# Patient Record
Sex: Female | Born: 1995
Health system: Southern US, Community
[De-identification: ages and names within clinical notes are randomized; demographics above are authoritative.]

## PROBLEM LIST (undated history)

## (undated) DIAGNOSIS — R011 Cardiac murmur, unspecified: Secondary | ICD-10-CM

## (undated) DIAGNOSIS — G7111 Myotonic muscular dystrophy: Secondary | ICD-10-CM

## (undated) DIAGNOSIS — R569 Unspecified convulsions: Secondary | ICD-10-CM

## (undated) DIAGNOSIS — K900 Celiac disease: Secondary | ICD-10-CM

## (undated) DIAGNOSIS — R220 Localized swelling, mass and lump, head: Secondary | ICD-10-CM

## (undated) HISTORY — DX: Myotonic muscular dystrophy: G71.11

---
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
## 2016-01-01 DIAGNOSIS — Z309 Encounter for contraceptive management, unspecified: Secondary | ICD-10-CM | POA: Diagnosis not present

## 2016-01-01 DIAGNOSIS — G4700 Insomnia, unspecified: Secondary | ICD-10-CM | POA: Diagnosis not present

## 2016-06-30 DIAGNOSIS — L309 Dermatitis, unspecified: Secondary | ICD-10-CM | POA: Diagnosis not present

## 2016-06-30 DIAGNOSIS — R1110 Vomiting, unspecified: Secondary | ICD-10-CM | POA: Diagnosis not present

## 2016-11-24 DIAGNOSIS — S93491A Sprain of other ligament of right ankle, initial encounter: Secondary | ICD-10-CM | POA: Diagnosis not present

## 2017-01-31 ENCOUNTER — Other Ambulatory Visit: Payer: Self-pay | Admitting: Family Medicine

## 2017-01-31 ENCOUNTER — Other Ambulatory Visit (HOSPITAL_COMMUNITY)
Admission: RE | Admit: 2017-01-31 | Discharge: 2017-01-31 | Disposition: A | Discharge status: Home / Self Care | Payer: BLUE CROSS/BLUE SHIELD | Source: Ambulatory Visit | Attending: Family Medicine | Admitting: Family Medicine

## 2017-01-31 DIAGNOSIS — Z01419 Encounter for gynecological examination (general) (routine) without abnormal findings: Secondary | ICD-10-CM | POA: Diagnosis not present

## 2017-01-31 DIAGNOSIS — Z309 Encounter for contraceptive management, unspecified: Secondary | ICD-10-CM | POA: Diagnosis not present

## 2017-01-31 DIAGNOSIS — Z124 Encounter for screening for malignant neoplasm of cervix: Secondary | ICD-10-CM | POA: Diagnosis not present

## 2017-01-31 DIAGNOSIS — Z113 Encounter for screening for infections with a predominantly sexual mode of transmission: Secondary | ICD-10-CM | POA: Diagnosis not present

## 2017-02-01 LAB — CYTOLOGY - PAP: DIAGNOSIS: NEGATIVE

## 2017-04-20 DIAGNOSIS — J0120 Acute ethmoidal sinusitis, unspecified: Secondary | ICD-10-CM | POA: Diagnosis not present

## 2017-06-26 DIAGNOSIS — J209 Acute bronchitis, unspecified: Secondary | ICD-10-CM | POA: Diagnosis not present

## 2018-04-09 DIAGNOSIS — J209 Acute bronchitis, unspecified: Secondary | ICD-10-CM | POA: Diagnosis not present

## 2018-04-09 DIAGNOSIS — Z23 Encounter for immunization: Secondary | ICD-10-CM | POA: Diagnosis not present

## 2018-04-09 DIAGNOSIS — J029 Acute pharyngitis, unspecified: Secondary | ICD-10-CM | POA: Diagnosis not present

## 2018-04-09 DIAGNOSIS — R5383 Other fatigue: Secondary | ICD-10-CM | POA: Diagnosis not present

## 2018-05-07 DIAGNOSIS — Z833 Family history of diabetes mellitus: Secondary | ICD-10-CM | POA: Diagnosis not present

## 2018-05-07 DIAGNOSIS — Z113 Encounter for screening for infections with a predominantly sexual mode of transmission: Secondary | ICD-10-CM | POA: Diagnosis not present

## 2018-05-07 DIAGNOSIS — Z01419 Encounter for gynecological examination (general) (routine) without abnormal findings: Secondary | ICD-10-CM | POA: Diagnosis not present

## 2018-05-07 DIAGNOSIS — Z8342 Family history of familial hypercholesterolemia: Secondary | ICD-10-CM | POA: Diagnosis not present

## 2018-05-10 DIAGNOSIS — J45909 Unspecified asthma, uncomplicated: Secondary | ICD-10-CM | POA: Diagnosis not present

## 2018-05-30 DIAGNOSIS — R05 Cough: Secondary | ICD-10-CM | POA: Diagnosis not present

## 2018-05-30 DIAGNOSIS — R062 Wheezing: Secondary | ICD-10-CM | POA: Diagnosis not present

## 2018-05-30 DIAGNOSIS — Z3042 Encounter for surveillance of injectable contraceptive: Secondary | ICD-10-CM | POA: Diagnosis not present

## 2018-06-13 ENCOUNTER — Other Ambulatory Visit: Payer: Self-pay

## 2018-06-13 ENCOUNTER — Ambulatory Visit (INDEPENDENT_AMBULATORY_CARE_PROVIDER_SITE_OTHER): Payer: BLUE CROSS/BLUE SHIELD

## 2018-06-13 ENCOUNTER — Ambulatory Visit (HOSPITAL_COMMUNITY)
Admission: EM | Admit: 2018-06-13 | Discharge: 2018-06-13 | Disposition: A | Discharge status: Home / Self Care | Payer: BLUE CROSS/BLUE SHIELD | Attending: Family Medicine | Admitting: Family Medicine

## 2018-06-13 ENCOUNTER — Encounter (HOSPITAL_COMMUNITY): Payer: Self-pay | Admitting: Emergency Medicine

## 2018-06-13 DIAGNOSIS — R05 Cough: Secondary | ICD-10-CM

## 2018-06-13 DIAGNOSIS — R059 Cough, unspecified: Secondary | ICD-10-CM

## 2018-06-13 DIAGNOSIS — R0602 Shortness of breath: Secondary | ICD-10-CM | POA: Diagnosis not present

## 2018-06-13 MED ORDER — HYDROCODONE-HOMATROPINE 5-1.5 MG/5ML PO SYRP: 5.0000 mL | ORAL_SOLUTION | Freq: Four times a day (QID) | ORAL | 0 refills | Status: DC | PRN

## 2018-06-13 NOTE — ED Triage Notes (Signed)
Diagnosed with bronchitis in january 2020-minute clinc-albuterol and congestion medicine.  Seen pcp 2 times .  One time given zpac and prednisone with specific dose x 5 days.  A second visit.  Received asmanex and taking dayquil.    Patient reports no improvement.    Complains of sob, wheezing, facial pressure and bilateral ear pressure.  Constant coughing,no fever

## 2018-06-18 DIAGNOSIS — Z3042 Encounter for surveillance of injectable contraceptive: Secondary | ICD-10-CM | POA: Diagnosis not present

## 2018-06-18 NOTE — ED Provider Notes (Signed)
[place]   [id number] 06/13/18 Arrival Time: [place]:  1. Cough    I have personally viewed the imaging studies ordered this visit. No signs of PNA or infectious process. Discussed. Likely post-viral. Will watch closely over the next week.  Meds ordered this encounter  Medications  . HYDROcodone-homatropine (HYCODAN) 5-1.5 MG/5ML syrup    Sig: Take 5 mLs by mouth every 6 (six) hours as needed for cough.    Dispense:  90 mL    Refill:  0   Cough medication sedation precautions. Discussed typical duration of symptoms. OTC symptom care as needed. Ensure adequate fluid intake and rest. May f/u with PCP or here as needed.  Reviewed expectations re: course of current medical issues. Questions answered. Outlined signs and symptoms indicating need for more acute intervention. Patient verbalized understanding. After Visit Summary given.   SUBJECTIVE: History from: patient.  Chloe Rice is a 23 y.o. female who presents with complaint of nasal congestion, post-nasal drainage, and a persistent dry cough; without sore throat. Cold symptoms better; cough is bothering her the most. Onset of coughing abrupt, 2 weeks ago; currently without significant fatigue and without body aches. SOB: none. Wheezing: none. Fever: no. Overall normal PO intake without n/v. Known sick contacts: no. No specific or significant aggravating or alleviating factors reported. OTC treatment: cough meds without much relief. Has finished a ZPak and prednisone without much help.  Social History   Tobacco Use  Smoking Status Never Smoker  Smokeless Tobacco Never Used    ROS: As per HPI. All other systems negative.    OBJECTIVE:  Vitals:   06/13/18 1938  BP: 120/80  Pulse: (!) 103  Resp: 18  Temp: 99.5 F (37.5 C)  TempSrc: Oral  SpO2: 97%     General appearance: alert; appears fatigued HEENT: nasal congestion; clear runny nose; throat irritation secondary to post-nasal  drainage Neck: supple without LAD CV: RRR Lungs: unlabored respirations, symmetrical air entry without wheezing; cough: moderate and dry Abd: soft Ext: no LE edema Skin: warm and dry Psychological: alert and cooperative; normal mood and affect  Imaging: Dg Chest 2 View  Result Date: 06/13/2018 CLINICAL DATA:  23 year old female with cough and shortness of breath. EXAM: CHEST - 2 VIEW COMPARISON:  None. FINDINGS: Normal lung volumes and mediastinal contours. Visualized tracheal air column is within normal limits. Both lungs are clear. No pneumothorax or pleural effusion. No osseous abnormality identified. Negative visible bowel gas pattern. IMPRESSION: Negative.  No cardiopulmonary abnormality. Electronically Signed   By: Genevie Ann M.D.   On: 06/13/2018 20:07    Allergies  Allergen Reactions  . Gluten Meal Other (See Comments)    CELIAC DISEASE    Past Medical History:  Diagnosis Date  . Celiac disease since age 54  . Heart murmur    as an infant, mother states she was "cleared" at age 26 mos.  . Seizure [facility]) age 61   x 1; unknown cause, was not on anticonvulsant  . Seizures [facility]) age 8 mos.   febrile x 2  . Superficial swelling of scalp 06/2012   benign nodular swelling left occipital scalp   Family History  Problem Relation Age of Onset  . Heart disease Maternal Grandmother        MI  . Kidney disease Maternal Grandmother        was on dialysis  . Stroke Paternal Grandmother   . Heart disease Paternal Grandfather   . Asthma Mother   .  Hypertension Father        treating with diet and exercise  . Diabetes Maternal Uncle        juvenile   Social History   Socioeconomic History  . Marital status: Single    Spouse name: Not on file  . Number of children: Not on file  . Years of education: Not on file  . Highest education level: Not on file  Occupational History  . Not on file  Social Needs  . Financial resource strain: Not on file  . Food insecurity:    Worry:  Not on file    Inability: Not on file  . Transportation needs:    Medical: Not on file    Non-medical: Not on file  Tobacco Use  . Smoking status: Never Smoker  . Smokeless tobacco: Never Used  Substance and Sexual Activity  . Alcohol use: No  . Drug use: No  . Sexual activity: Not on file  Lifestyle  . Physical activity:    Days per week: Not on file    Minutes per session: Not on file  . Stress: Not on file  Relationships  . Social connections:    Talks on phone: Not on file    Gets together: Not on file    Attends religious service: Not on file    Active member of club or organization: Not on file    Attends meetings of clubs or organizations: Not on file    Relationship status: Not on file  . Intimate partner violence:    Fear of current or ex partner: Not on file    Emotionally abused: Not on file    Physically abused: Not on file    Forced sexual activity: Not on file  Other Topics Concern  . Not on file  Social History Narrative  . Not on file           Vanessa Kick, MD 06/18/18 [phone]

## 2018-06-20 DIAGNOSIS — R0989 Other specified symptoms and signs involving the circulatory and respiratory systems: Secondary | ICD-10-CM | POA: Diagnosis not present

## 2018-06-20 DIAGNOSIS — R05 Cough: Secondary | ICD-10-CM | POA: Diagnosis not present

## 2018-06-20 DIAGNOSIS — R232 Flushing: Secondary | ICD-10-CM | POA: Diagnosis not present

## 2018-07-04 ENCOUNTER — Institutional Professional Consult (permissible substitution): Payer: BLUE CROSS/BLUE SHIELD | Admitting: Pulmonary Disease

## 2018-08-22 DIAGNOSIS — Z3042 Encounter for surveillance of injectable contraceptive: Secondary | ICD-10-CM | POA: Diagnosis not present

## 2018-11-09 DIAGNOSIS — Z3042 Encounter for surveillance of injectable contraceptive: Secondary | ICD-10-CM | POA: Diagnosis not present

## 2019-01-28 DIAGNOSIS — Z3042 Encounter for surveillance of injectable contraceptive: Secondary | ICD-10-CM | POA: Diagnosis not present

## 2019-04-24 DIAGNOSIS — Z3042 Encounter for surveillance of injectable contraceptive: Secondary | ICD-10-CM | POA: Diagnosis not present

## 2019-04-26 DIAGNOSIS — F411 Generalized anxiety disorder: Secondary | ICD-10-CM | POA: Diagnosis not present

## 2019-04-26 DIAGNOSIS — G4700 Insomnia, unspecified: Secondary | ICD-10-CM | POA: Diagnosis not present

## 2019-05-13 DIAGNOSIS — F419 Anxiety disorder, unspecified: Secondary | ICD-10-CM | POA: Diagnosis not present

## 2019-07-12 DIAGNOSIS — H538 Other visual disturbances: Secondary | ICD-10-CM | POA: Diagnosis not present

## 2019-07-12 DIAGNOSIS — F419 Anxiety disorder, unspecified: Secondary | ICD-10-CM | POA: Diagnosis not present

## 2019-07-22 DIAGNOSIS — Z3042 Encounter for surveillance of injectable contraceptive: Secondary | ICD-10-CM | POA: Diagnosis not present

## 2019-07-30 DIAGNOSIS — H5203 Hypermetropia, bilateral: Secondary | ICD-10-CM | POA: Diagnosis not present

## 2019-07-30 DIAGNOSIS — H25043 Posterior subcapsular polar age-related cataract, bilateral: Secondary | ICD-10-CM | POA: Diagnosis not present

## 2019-08-06 DIAGNOSIS — H25042 Posterior subcapsular polar age-related cataract, left eye: Secondary | ICD-10-CM | POA: Diagnosis not present

## 2019-08-06 DIAGNOSIS — Z01818 Encounter for other preprocedural examination: Secondary | ICD-10-CM | POA: Diagnosis not present

## 2019-08-15 DIAGNOSIS — H25042 Posterior subcapsular polar age-related cataract, left eye: Secondary | ICD-10-CM | POA: Diagnosis not present

## 2019-08-15 DIAGNOSIS — H2512 Age-related nuclear cataract, left eye: Secondary | ICD-10-CM | POA: Diagnosis not present

## 2019-08-15 DIAGNOSIS — H25812 Combined forms of age-related cataract, left eye: Secondary | ICD-10-CM | POA: Diagnosis not present

## 2019-09-12 DIAGNOSIS — H25811 Combined forms of age-related cataract, right eye: Secondary | ICD-10-CM | POA: Diagnosis not present

## 2019-09-12 DIAGNOSIS — H25041 Posterior subcapsular polar age-related cataract, right eye: Secondary | ICD-10-CM | POA: Diagnosis not present

## 2019-09-12 DIAGNOSIS — H2511 Age-related nuclear cataract, right eye: Secondary | ICD-10-CM | POA: Diagnosis not present

## 2019-10-11 DIAGNOSIS — G4700 Insomnia, unspecified: Secondary | ICD-10-CM | POA: Diagnosis not present

## 2019-10-15 DIAGNOSIS — Z3042 Encounter for surveillance of injectable contraceptive: Secondary | ICD-10-CM | POA: Diagnosis not present

## 2019-11-13 DIAGNOSIS — Z124 Encounter for screening for malignant neoplasm of cervix: Secondary | ICD-10-CM | POA: Diagnosis not present

## 2019-11-13 DIAGNOSIS — Z01419 Encounter for gynecological examination (general) (routine) without abnormal findings: Secondary | ICD-10-CM | POA: Diagnosis not present

## 2019-11-13 DIAGNOSIS — Z862 Personal history of diseases of the blood and blood-forming organs and certain disorders involving the immune mechanism: Secondary | ICD-10-CM | POA: Diagnosis not present

## 2019-11-13 DIAGNOSIS — E669 Obesity, unspecified: Secondary | ICD-10-CM | POA: Diagnosis not present

## 2019-11-13 DIAGNOSIS — Z13220 Encounter for screening for lipoid disorders: Secondary | ICD-10-CM | POA: Diagnosis not present

## 2019-11-13 DIAGNOSIS — Z113 Encounter for screening for infections with a predominantly sexual mode of transmission: Secondary | ICD-10-CM | POA: Diagnosis not present

## 2019-12-31 DIAGNOSIS — Z3042 Encounter for surveillance of injectable contraceptive: Secondary | ICD-10-CM | POA: Diagnosis not present

## 2020-01-17 IMAGING — DX CHEST - 2 VIEW
2 series · 2 of 2 positions shown · non-contrast
Comparison: None.

CLINICAL DATA: 22-year-old female with cough and shortness of
breath.

EXAM:
CHEST - 2 VIEW

[chest pa]
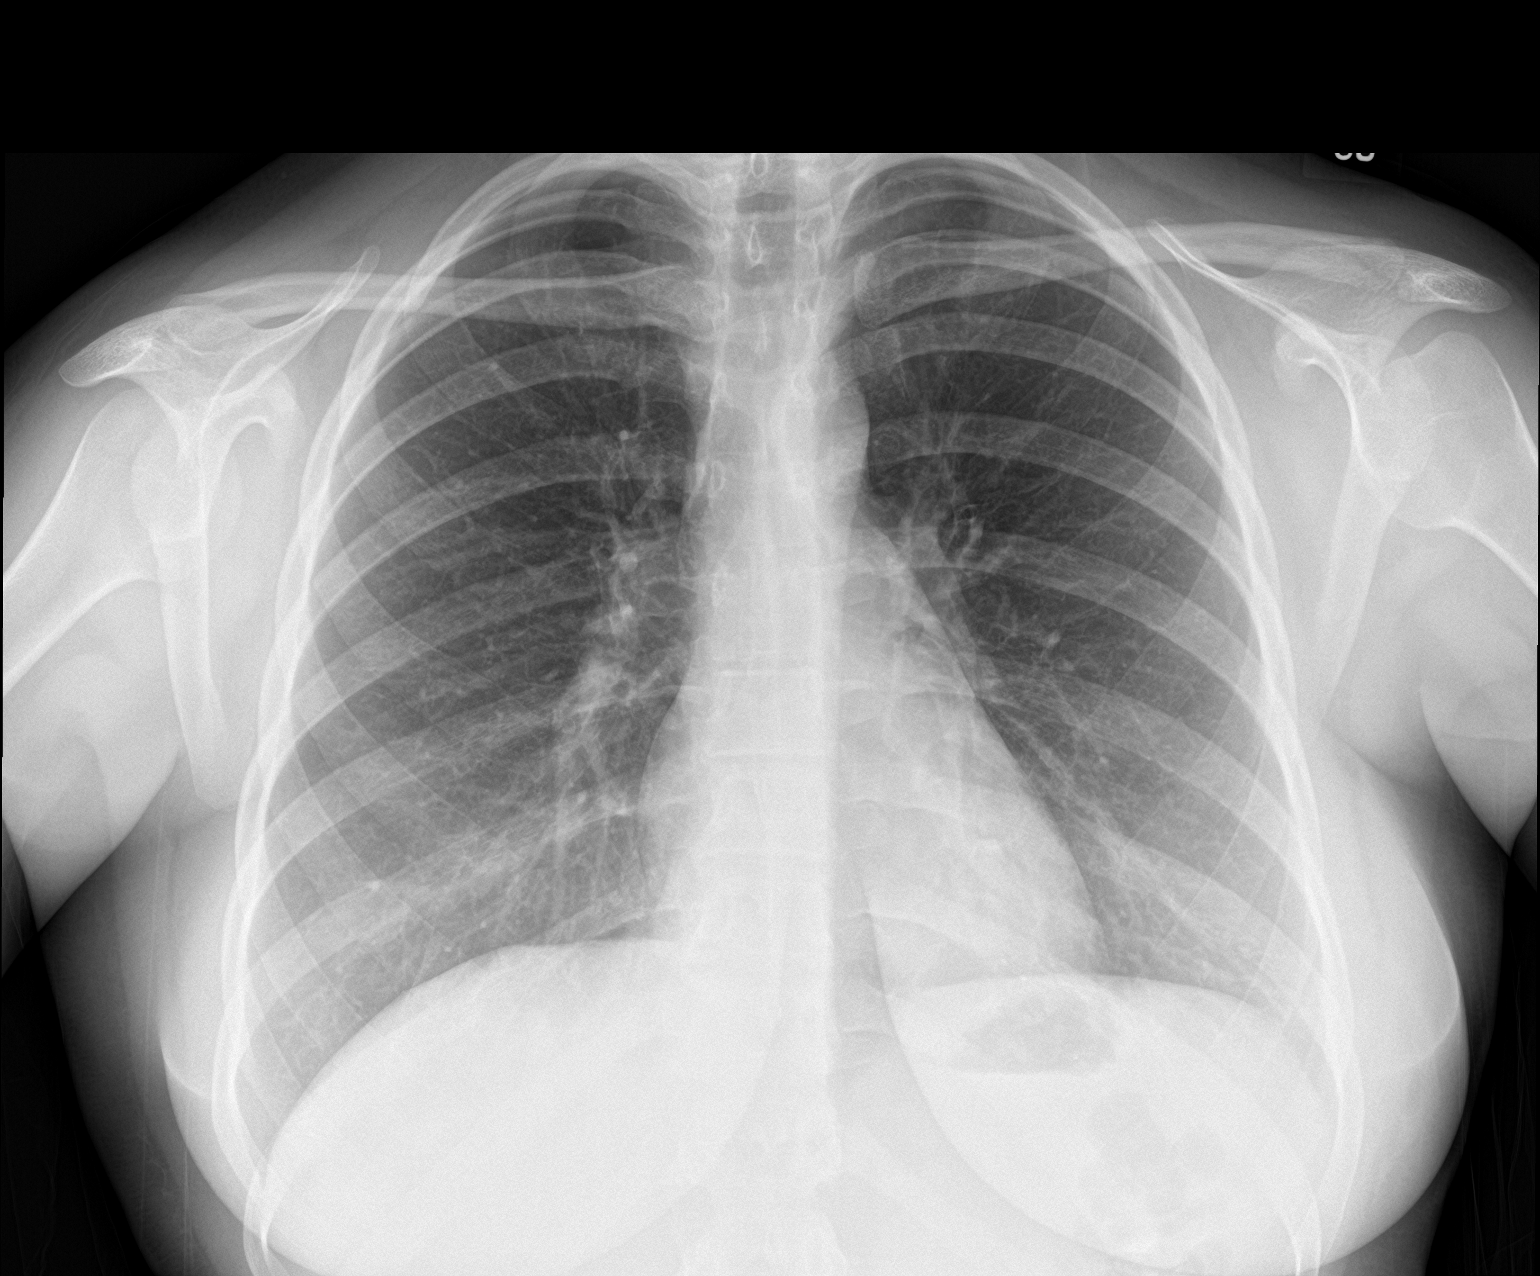

[chest lat]
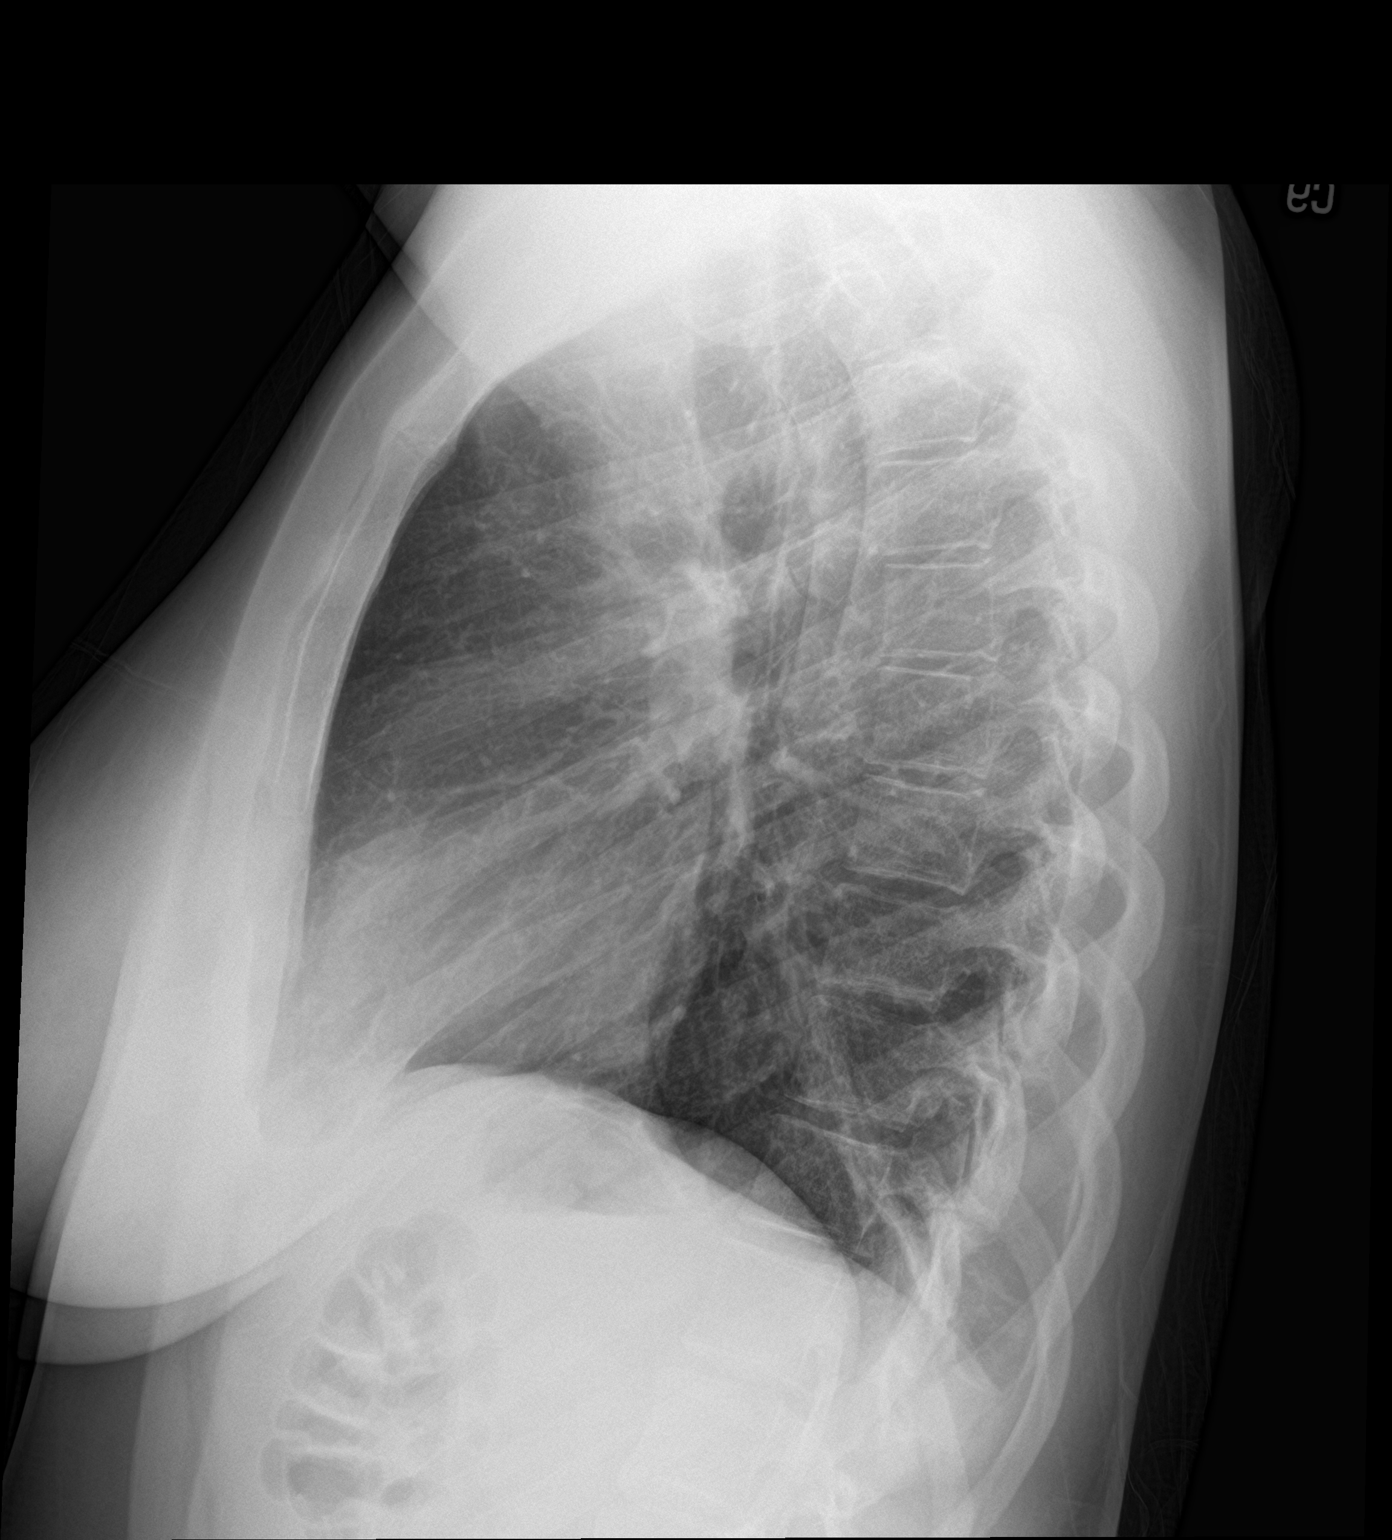

[2 of 2 positions shown; findings below may reference images not displayed]

FINDINGS: Normal lung volumes and mediastinal contours. Visualized tracheal
air column is within normal limits. Both lungs are clear. No
pneumothorax or pleural effusion. No osseous abnormality identified.
Negative visible bowel gas pattern.
IMPRESSION: Negative.  No cardiopulmonary abnormality.

## 2020-04-01 ENCOUNTER — Other Ambulatory Visit (HOSPITAL_COMMUNITY): Payer: Self-pay | Admitting: Family Medicine

## 2020-04-01 DIAGNOSIS — Z3042 Encounter for surveillance of injectable contraceptive: Secondary | ICD-10-CM | POA: Diagnosis not present

## 2020-04-01 DIAGNOSIS — Z3202 Encounter for pregnancy test, result negative: Secondary | ICD-10-CM | POA: Diagnosis not present

## 2020-04-09 DIAGNOSIS — U071 COVID-19: Secondary | ICD-10-CM | POA: Diagnosis not present

## 2020-06-17 DIAGNOSIS — Z3042 Encounter for surveillance of injectable contraceptive: Secondary | ICD-10-CM | POA: Diagnosis not present

## 2020-06-24 DIAGNOSIS — G479 Sleep disorder, unspecified: Secondary | ICD-10-CM | POA: Diagnosis not present

## 2020-06-24 DIAGNOSIS — F411 Generalized anxiety disorder: Secondary | ICD-10-CM | POA: Diagnosis not present

## 2020-09-02 DIAGNOSIS — Z3042 Encounter for surveillance of injectable contraceptive: Secondary | ICD-10-CM | POA: Diagnosis not present

## 2020-09-26 DIAGNOSIS — N3001 Acute cystitis with hematuria: Secondary | ICD-10-CM | POA: Diagnosis not present

## 2020-09-26 DIAGNOSIS — M542 Cervicalgia: Secondary | ICD-10-CM | POA: Diagnosis not present

## 2020-09-26 DIAGNOSIS — R110 Nausea: Secondary | ICD-10-CM | POA: Diagnosis not present

## 2020-09-26 DIAGNOSIS — S161XXA Strain of muscle, fascia and tendon at neck level, initial encounter: Secondary | ICD-10-CM | POA: Diagnosis not present

## 2020-09-26 DIAGNOSIS — M79601 Pain in right arm: Secondary | ICD-10-CM | POA: Diagnosis not present

## 2020-09-26 DIAGNOSIS — R519 Headache, unspecified: Secondary | ICD-10-CM | POA: Diagnosis not present

## 2020-11-16 DIAGNOSIS — J029 Acute pharyngitis, unspecified: Secondary | ICD-10-CM | POA: Diagnosis not present

## 2020-11-16 DIAGNOSIS — Z20822 Contact with and (suspected) exposure to covid-19: Secondary | ICD-10-CM | POA: Diagnosis not present

## 2020-11-16 DIAGNOSIS — J069 Acute upper respiratory infection, unspecified: Secondary | ICD-10-CM | POA: Diagnosis not present

## 2020-11-30 DIAGNOSIS — Z3042 Encounter for surveillance of injectable contraceptive: Secondary | ICD-10-CM | POA: Diagnosis not present

## 2021-02-01 DIAGNOSIS — J111 Influenza due to unidentified influenza virus with other respiratory manifestations: Secondary | ICD-10-CM | POA: Diagnosis not present

## 2021-02-01 DIAGNOSIS — R509 Fever, unspecified: Secondary | ICD-10-CM | POA: Diagnosis not present

## 2021-02-01 DIAGNOSIS — Z20822 Contact with and (suspected) exposure to covid-19: Secondary | ICD-10-CM | POA: Diagnosis not present

## 2021-02-01 DIAGNOSIS — Z03818 Encounter for observation for suspected exposure to other biological agents ruled out: Secondary | ICD-10-CM | POA: Diagnosis not present

## 2021-02-09 DIAGNOSIS — F411 Generalized anxiety disorder: Secondary | ICD-10-CM | POA: Diagnosis not present

## 2021-02-09 DIAGNOSIS — G479 Sleep disorder, unspecified: Secondary | ICD-10-CM | POA: Diagnosis not present

## 2021-02-17 DIAGNOSIS — Z01419 Encounter for gynecological examination (general) (routine) without abnormal findings: Secondary | ICD-10-CM | POA: Diagnosis not present

## 2021-02-17 DIAGNOSIS — Z3042 Encounter for surveillance of injectable contraceptive: Secondary | ICD-10-CM | POA: Diagnosis not present

## 2021-03-30 DIAGNOSIS — Z20822 Contact with and (suspected) exposure to covid-19: Secondary | ICD-10-CM | POA: Diagnosis not present

## 2021-03-30 DIAGNOSIS — Z03818 Encounter for observation for suspected exposure to other biological agents ruled out: Secondary | ICD-10-CM | POA: Diagnosis not present

## 2021-10-08 ENCOUNTER — Other Ambulatory Visit: Payer: Self-pay

## 2021-10-08 ENCOUNTER — Encounter (HOSPITAL_BASED_OUTPATIENT_CLINIC_OR_DEPARTMENT_OTHER): Payer: Self-pay

## 2021-10-08 ENCOUNTER — Emergency Department (HOSPITAL_BASED_OUTPATIENT_CLINIC_OR_DEPARTMENT_OTHER)
Admission: EM | Admit: 2021-10-08 | Discharge: 2021-10-08 | Discharge status: Against Medical Advice | Payer: Self-pay | Attending: Emergency Medicine | Admitting: Emergency Medicine

## 2021-10-08 DIAGNOSIS — Z5321 Procedure and treatment not carried out due to patient leaving prior to being seen by health care provider: Secondary | ICD-10-CM | POA: Insufficient documentation

## 2021-10-08 DIAGNOSIS — M545 Low back pain, unspecified: Secondary | ICD-10-CM | POA: Insufficient documentation

## 2021-10-08 DIAGNOSIS — M549 Dorsalgia, unspecified: Secondary | ICD-10-CM

## 2021-10-08 NOTE — ED Notes (Signed)
Pt stated she is not having any pain at this time and has changed her mind on being seen. Pt signed AMA form and left.

## 2021-10-08 NOTE — ED Triage Notes (Signed)
Pt reports persistent, intermittent back pain xseveral weeks. No injury reported. Pt ambulatory to triage.

## 2021-10-22 ENCOUNTER — Other Ambulatory Visit: Payer: Self-pay

## 2021-10-22 MED ORDER — MEDROXYPROGESTERONE ACETATE 150 MG/ML IM SUSY
PREFILLED_SYRINGE | INTRAMUSCULAR | 0 refills | Status: DC
  Filled 2021-10-22: qty 1, 90d supply, fill #0

## 2022-03-07 ENCOUNTER — Other Ambulatory Visit: Payer: Self-pay | Admitting: Nurse Practitioner

## 2022-03-07 DIAGNOSIS — N644 Mastodynia: Secondary | ICD-10-CM

## 2022-04-01 ENCOUNTER — Other Ambulatory Visit: Payer: Self-pay | Admitting: Family Medicine

## 2022-04-01 DIAGNOSIS — R221 Localized swelling, mass and lump, neck: Secondary | ICD-10-CM

## 2022-04-13 ENCOUNTER — Ambulatory Visit
Admission: RE | Admit: 2022-04-13 | Discharge: 2022-04-13 | Disposition: A | Discharge status: Home / Self Care | Payer: 59 | Source: Ambulatory Visit | Attending: Family Medicine | Admitting: Family Medicine

## 2022-04-13 DIAGNOSIS — R221 Localized swelling, mass and lump, neck: Secondary | ICD-10-CM

## 2022-04-19 ENCOUNTER — Ambulatory Visit
Admission: RE | Admit: 2022-04-19 | Discharge: 2022-04-19 | Disposition: A | Discharge status: Home / Self Care | Payer: 59 | Source: Ambulatory Visit | Attending: Nurse Practitioner | Admitting: Nurse Practitioner

## 2022-04-19 DIAGNOSIS — N644 Mastodynia: Secondary | ICD-10-CM

## 2023-03-09 ENCOUNTER — Other Ambulatory Visit: Payer: Self-pay | Admitting: Nurse Practitioner

## 2023-03-09 ENCOUNTER — Other Ambulatory Visit (HOSPITAL_COMMUNITY)
Admission: RE | Admit: 2023-03-09 | Discharge: 2023-03-09 | Disposition: A | Discharge status: Home / Self Care | Payer: 59 | Source: Ambulatory Visit | Attending: Nurse Practitioner | Admitting: Nurse Practitioner

## 2023-03-09 DIAGNOSIS — Z124 Encounter for screening for malignant neoplasm of cervix: Secondary | ICD-10-CM | POA: Diagnosis present

## 2023-03-13 LAB — CYTOLOGY - PAP: Diagnosis: NEGATIVE

## 2023-07-02 ENCOUNTER — Emergency Department (HOSPITAL_BASED_OUTPATIENT_CLINIC_OR_DEPARTMENT_OTHER): Admitting: Radiology

## 2023-07-02 ENCOUNTER — Encounter (HOSPITAL_BASED_OUTPATIENT_CLINIC_OR_DEPARTMENT_OTHER): Payer: Self-pay | Admitting: Emergency Medicine

## 2023-07-02 ENCOUNTER — Other Ambulatory Visit: Payer: Self-pay

## 2023-07-02 DIAGNOSIS — R739 Hyperglycemia, unspecified: Secondary | ICD-10-CM | POA: Insufficient documentation

## 2023-07-02 DIAGNOSIS — K859 Acute pancreatitis without necrosis or infection, unspecified: Secondary | ICD-10-CM | POA: Insufficient documentation

## 2023-07-02 DIAGNOSIS — R0602 Shortness of breath: Secondary | ICD-10-CM | POA: Diagnosis not present

## 2023-07-02 DIAGNOSIS — R1011 Right upper quadrant pain: Secondary | ICD-10-CM | POA: Diagnosis present

## 2023-07-02 LAB — CBC
HCT: 46.9 % — ABNORMAL HIGH (ref 36.0–46.0)
Hemoglobin: 15.4 g/dL — ABNORMAL HIGH (ref 12.0–15.0)
MCH: 29.5 pg (ref 26.0–34.0)
MCHC: 32.8 g/dL (ref 30.0–36.0)
MCV: 89.8 fL (ref 80.0–100.0)
Platelets: 193 10*3/uL (ref 150–400)
RBC: 5.22 MIL/uL — ABNORMAL HIGH (ref 3.87–5.11)
RDW: 13.7 % (ref 11.5–15.5)
WBC: 8.3 10*3/uL (ref 4.0–10.5)
nRBC: 0 % (ref 0.0–0.2)

## 2023-07-02 NOTE — ED Triage Notes (Signed)
 Pt c/o back pain x 4 days, today abd pain, chest pain, dizziness, nausea started

## 2023-07-03 ENCOUNTER — Emergency Department (HOSPITAL_BASED_OUTPATIENT_CLINIC_OR_DEPARTMENT_OTHER)
Admission: EM | Admit: 2023-07-03 | Discharge: 2023-07-03 | Disposition: A | Discharge status: Home / Self Care | Attending: Emergency Medicine | Admitting: Emergency Medicine

## 2023-07-03 ENCOUNTER — Emergency Department (HOSPITAL_BASED_OUTPATIENT_CLINIC_OR_DEPARTMENT_OTHER)

## 2023-07-03 DIAGNOSIS — K8010 Calculus of gallbladder with chronic cholecystitis without obstruction: Secondary | ICD-10-CM | POA: Diagnosis not present

## 2023-07-03 DIAGNOSIS — R739 Hyperglycemia, unspecified: Secondary | ICD-10-CM

## 2023-07-03 DIAGNOSIS — K859 Acute pancreatitis without necrosis or infection, unspecified: Secondary | ICD-10-CM

## 2023-07-03 DIAGNOSIS — J9811 Atelectasis: Secondary | ICD-10-CM | POA: Diagnosis not present

## 2023-07-03 LAB — BASIC METABOLIC PANEL WITH GFR
Anion gap: 9 (ref 5–15)
BUN: 13 mg/dL (ref 6–20)
CO2: 25 mmol/L (ref 22–32)
Calcium: 9.7 mg/dL (ref 8.9–10.3)
Chloride: 106 mmol/L (ref 98–111)
Creatinine, Ser: 0.65 mg/dL (ref 0.44–1.00)
GFR, Estimated: >60 mL/min (ref >60)
Glucose, Bld: 125 mg/dL — ABNORMAL HIGH (ref 70–99)
Potassium: 3.9 mmol/L (ref 3.5–5.1)
Sodium: 140 mmol/L (ref 135–145)

## 2023-07-03 LAB — HEPATIC FUNCTION PANEL
ALT: 1032 U/L — ABNORMAL HIGH (ref 0–44)
AST: 828 U/L — ABNORMAL HIGH (ref 15–41)
Albumin: 4.3 g/dL (ref 3.5–5.0)
Alkaline Phosphatase: 127 U/L — ABNORMAL HIGH (ref 38–126)
Bilirubin, Direct: 2.3 mg/dL — ABNORMAL HIGH (ref 0.0–0.2)
Indirect Bilirubin: 1.4 mg/dL — ABNORMAL HIGH (ref 0.3–0.9)
Total Bilirubin: 3.7 mg/dL — ABNORMAL HIGH (ref 0.0–1.2)
Total Protein: 6.5 g/dL (ref 6.5–8.1)

## 2023-07-03 LAB — TROPONIN I (HIGH SENSITIVITY): Troponin I (High Sensitivity): 4 ng/L (ref <18)

## 2023-07-03 LAB — HCG, SERUM, QUALITATIVE: Preg, Serum: NEGATIVE

## 2023-07-03 LAB — LIPASE, BLOOD: Lipase: 4840 U/L — ABNORMAL HIGH (ref 11–51)

## 2023-07-03 MED ORDER — ONDANSETRON HCL 4 MG/2ML IJ SOLN
4.0000 mg | Freq: Once | INTRAMUSCULAR | Status: AC
  Administered 2023-07-03: 4 mg via INTRAVENOUS
  Filled 2023-07-03: qty 2

## 2023-07-03 MED ORDER — PANTOPRAZOLE SODIUM 40 MG PO TBEC
40.0000 mg | DELAYED_RELEASE_TABLET | Freq: Once | ORAL | Status: AC
  Administered 2023-07-03: 40 mg via ORAL
  Filled 2023-07-03: qty 1

## 2023-07-03 MED ORDER — PANTOPRAZOLE SODIUM 40 MG PO TBEC: 40.0000 mg | DELAYED_RELEASE_TABLET | Freq: Every day | ORAL | 0 refills | Status: DC

## 2023-07-03 MED ORDER — HYDROCODONE-ACETAMINOPHEN 5-325 MG PO TABS: 1.0000 | ORAL_TABLET | ORAL | 0 refills | Status: DC | PRN

## 2023-07-03 MED ORDER — HYDROCODONE-ACETAMINOPHEN 5-325 MG PO TABS
1.0000 | ORAL_TABLET | Freq: Once | ORAL | Status: AC
  Administered 2023-07-03: 1 via ORAL
  Filled 2023-07-03: qty 1

## 2023-07-03 MED ORDER — ONDANSETRON 4 MG PO TBDP: 4.0000 mg | ORAL_TABLET | Freq: Three times a day (TID) | ORAL | 0 refills | Status: DC | PRN

## 2023-07-03 MED ORDER — MORPHINE SULFATE (PF) 4 MG/ML IV SOLN
4.0000 mg | Freq: Once | INTRAVENOUS | Status: AC
  Administered 2023-07-03: 4 mg via INTRAVENOUS
  Filled 2023-07-03: qty 1

## 2023-07-03 MED ORDER — IOHEXOL 350 MG/ML SOLN
100.0000 mL | Freq: Once | INTRAVENOUS | Status: AC | PRN
  Administered 2023-07-03: 100 mL via INTRAVENOUS

## 2023-07-03 NOTE — ED Notes (Signed)
 Patient transported to CT

## 2023-07-03 NOTE — ED Notes (Signed)
Ice pack provided per pt request

## 2023-07-03 NOTE — ED Provider Notes (Signed)
 [place] EMERGENCY DEPARTMENT AT [facility] Provider Note   CSN: [id number] Arrival date & time: 07/02/23  2314     History  Chief Complaint  Patient presents with   Abdominal Pain    Chloe Rice is a 28 y.o. female.  The history is provided by the patient.  Abdominal Pain She has history of celiac disease and comes in complaining of left suprascapular pain which started 2 days ago and now seems to be radiating down to involve her abdomen.  Pain has been severe to where she developed shortness of breath and nausea and vomiting.  She has been taking ibuprofen without relief.  She denies any trauma.  She does take birth control pills.  She has not had any recent travel or surgery.  She does vape but denies tobacco use.  She states that pain is worse when she sits, also worse with taking a breath and movement.   Home Medications Prior to Admission medications   Medication Sig Start Date End Date Taking? Authorizing Provider  HYDROcodone-acetaminophen (NORCO/VICODIN) 5-325 MG tablet Take 1 tablet by mouth every 4 (four) hours as needed. 07/03/23  Yes Dione Booze, MD  ondansetron (ZOFRAN-ODT) 4 MG disintegrating tablet Take 1 tablet (4 mg total) by mouth every 8 (eight) hours as needed for nausea or vomiting. 07/03/23  Yes Dione Booze, MD  pantoprazole (PROTONIX) 40 MG tablet Take 1 tablet (40 mg total) by mouth daily. 07/03/23  Yes Dione Booze, MD  ALBUTEROL IN Inhale into the lungs.    [provider]  HYDROcodone-homatropine (HYCODAN) 5-1.5 MG/5ML syrup Take 5 mLs by mouth every 6 (six) hours as needed for cough. 06/13/18   Mardella Layman, MD  ibuprofen (ADVIL,MOTRIN) 800 MG tablet Take 800 mg by mouth every 8 (eight) hours as needed.    [provider]  medroxyPROGESTERone (DEPO-PROVERA) 150 MG/ML injection INJECT 1 ML INTO THE MUSCLE AS DIRECTED 04/01/20 04/01/21  Farris Has, MD  mometasone [facility]) 220 MCG/INH inhaler Inhale 2 puffs into the lungs daily.     [provider]  Pseudoephedrine-APAP-DM (DAYQUIL PO) Take by mouth.    [provider]      Allergies    Gluten meal and Oxycodone    Review of Systems   Review of Systems  Gastrointestinal:  Positive for abdominal pain.  All other systems reviewed and are negative.   Physical Exam Updated Vital Signs BP 114/87   Pulse 76   Temp 97.7 F (36.5 C)   Resp 18   Ht 5\' 2"  (1.575 m)   Wt 90.7 kg   SpO2 96%   BMI 36.58 kg/m  Physical Exam Vitals and nursing note reviewed.   28 year old female, resting comfortably and in no acute distress. Vital signs are normal. Oxygen saturation is 96%, which is normal. Head is normocephalic and atraumatic. PERRLA, EOMI. Oropharynx is clear. Neck is nontender and supple without adenopathy or JVD. Back is mildly tender in the left suprascapular area.  There is no crepitus. Lungs are clear without rales, wheezes, or rhonchi. Chest is nontender. Heart has regular rate and rhythm without murmur. Abdomen is soft, flat, with mild tenderness in the right upper abdomen.  There is no rebound or guarding.  There is negative Murphy sign. Extremities have no cyanosis or edema, full range of motion is present. Skin is warm and dry without rash. Neurologic: Mental status is normal, moves all extremities equally.  ED Results / Procedures / Treatments   Labs (all labs  ordered are listed, but only abnormal results are displayed) Labs Reviewed  BASIC METABOLIC PANEL WITH GFR - Abnormal; Notable for the following components:      Result Value   Glucose, Bld 125 (*)    All other components within normal limits  CBC - Abnormal; Notable for the following components:   RBC 5.22 (*)    Hemoglobin 15.4 (*)    HCT 46.9 (*)    All other components within normal limits  LIPASE, BLOOD - Abnormal; Notable for the following components:   Lipase 4,840 (*)    All other components within normal limits  HCG, SERUM, QUALITATIVE  HEPATIC FUNCTION  PANEL  TROPONIN I (HIGH SENSITIVITY)    EKG EKG Interpretation Date/Time:  Sunday July 02 2023 23:22:27 EDT Ventricular Rate:  100 PR Interval:  120 QRS Duration:  74 QT Interval:  270 QTC Calculation: 348 R Axis:   18  Text Interpretation: Normal sinus rhythm Septal infarct , age undetermined Nonspecific T wave abnormality Abnormal ECG When compared with ECG of 18-Jun-2007 00:04, T wave abnormality is now present Confirmed by Dione Booze ([id number]) on 07/02/2023 11:27:02 PM  Radiology CT Angio Chest/Abd/Pel for Dissection W and/or Wo Contrast Result Date: 07/03/2023 CLINICAL DATA:  Back pain for 4 days.  Aortic aneurysm suspected EXAM: CT ANGIOGRAPHY CHEST, ABDOMEN AND PELVIS TECHNIQUE: Non-contrast CT of the chest was initially obtained. Multidetector CT imaging through the chest, abdomen and pelvis was performed using the standard protocol during bolus administration of intravenous contrast. Multiplanar reconstructed images and MIPs were obtained and reviewed to evaluate the vascular anatomy. RADIATION DOSE REDUCTION: This exam was performed according to the departmental dose-optimization program which includes automated exposure control, adjustment of the mA and/or kV according to patient size and/or use of iterative reconstruction technique. CONTRAST:  OMNIPAQUE IOHEXOL 350 MG/ML SOLN COMPARISON:  None Available. FINDINGS: CTA CHEST FINDINGS Cardiovascular: Preferential opacification of the thoracic aorta. No evidence of thoracic aortic aneurysm or dissection. Normal heart size. No pericardial effusion. No pulmonary artery filling defects. Mediastinum/Nodes: Anterior mediastinal soft tissue attributed to thymus, rounded superiorly but homogeneous and without clear mass. No adenopathy or hematoma Lungs/Pleura: Dependent reticulation with mild volume loss attributed to atelectasis. Musculoskeletal: No acute finding Review of the MIP images confirms the above findings. CTA ABDOMEN AND PELVIS  FINDINGS VASCULAR Aorta: Normal Celiac: Unremarkable SMA: Unremarkable Renals: Unremarkable IMA: Patent Inflow: Unremarkable Veins: Unremarkable for arterial timing Review of the MIP images confirms the above findings. NON-VASCULAR Hepatobiliary: No focal liver abnormality.No evidence of biliary obstruction or stone. Pancreas: Diffuse expansion and peripancreatic stranding. No organized collection or necrosis seen. Spleen: Unremarkable. Adrenals/Urinary Tract: Negative adrenals. No hydronephrosis or stone. Unremarkable bladder. Stomach/Bowel: Low-density thickened appearance of the duodenum adjacent to the pancreas without visible ulcer. Lymphatic: No mass or adenopathy. Reproductive:No pathologic findings. Other: No ascites or pneumoperitoneum. Musculoskeletal: No acute abnormalities. Review of the MIP images confirms the above findings. IMPRESSION: 1. Acute edematous pancreatitis. 2. Thickened duodenum adjacent to the pancreas which could be duodenitis or secondary edema. 3. Normal aorta. Electronically Signed   By: Tiburcio Pea M.D.   On: 07/03/2023 04:51   DG Chest 2 View Result Date: 07/02/2023 CLINICAL DATA:  chest pain EXAM: CHEST - 2 VIEW COMPARISON:  06/13/2018. FINDINGS: Cardiac silhouette is unremarkable. No pneumothorax or pleural effusion. The lungs are clear. The visualized skeletal structures are unremarkable. IMPRESSION: No acute cardiopulmonary process. Electronically Signed   By: Layla Maw M.D.   On: 07/02/2023 23:50  Procedures Procedures    Medications Ordered in ED Medications  pantoprazole (PROTONIX) EC tablet 40 mg (has no administration in time range)  morphine (PF) 4 MG/ML injection 4 mg (4 mg Intravenous Given 07/03/23 0352)  ondansetron (ZOFRAN) injection 4 mg (4 mg Intravenous Given 07/03/23 0352)  iohexol (OMNIPAQUE) 350 MG/ML injection 100 mL (100 mLs Intravenous Contrast Given 07/03/23 0409)  morphine (PF) 4 MG/ML injection 4 mg (4 mg Intravenous Given 07/03/23  0532)    ED Course/ Medical Decision Making/ A&P                                 Medical Decision Making Amount and/or Complexity of Data Reviewed Labs: ordered. Radiology: ordered.  Risk Prescription drug management.   Left upper back pain and abdominal pain, etiology unclear.  Severity of pain does not match her physical exam.  No history of trauma.  I have ordered CT angiogram to look for possible aortic dissection.  I have reviewed her laboratory test, my interpretation is elevated random glucose level, normal troponin, normal CBC.  I have reviewed her electrocardiogram, my interpretation is nonspecific T wave changes.  Chest x-ray shows no acute cardiopulmonary process.  I have independently viewed the images, and agree with radiologist's interpretation.  CT angiogram shows acute pancreatitis.  I have ordered a lipase which is come back over 4000 consistent with pancreatitis.  She had good relief of pain with morphine but did require additional morphine.  She had good relief of nausea with ondansetron.  At this point, she is resting comfortably and I feel she is safe for treatment as an outpatient.  CT scan did not show evidence of cholelithiasis although cholelithiasis can certainly be missed.  She will need further outpatient workup through her primary care provider or gastroenterologist.  I am discharging her with prescriptions for pantoprazole, oral ondansetron oral dissolving tablet, and hydrocodone-acetaminophen.  Return if symptoms or not being adequately controlled at home.  Final Clinical Impression(s) / ED Diagnoses Final diagnoses:  Acute pancreatitis without infection or necrosis, unspecified pancreatitis type  Elevated random blood glucose level    Rx / DC Orders ED Discharge Orders          Ordered    pantoprazole (PROTONIX) 40 MG tablet  Daily        07/03/23 0630    ondansetron (ZOFRAN-ODT) 4 MG disintegrating tablet  Every 8 hours PRN        07/03/23 0630     HYDROcodone-acetaminophen (NORCO/VICODIN) 5-325 MG tablet  Every 4 hours PRN        07/03/23 0630              Dione Booze, MD 07/03/23 [phone]

## 2023-07-03 NOTE — Discharge Instructions (Addendum)
 Take ondansetron as needed for nausea, hydrocodone-acetaminophen as needed for pain.  Return to the emergency department if pain or nausea not being adequately controlled at home.  Follow-up with your primary care provider and/your gastroenterologist for further evaluation.  I had added some blood work which was not available at the time of discharge.  This is looking for abnormal liver enzymes.  You can check this report on MyChart.

## 2023-07-05 ENCOUNTER — Other Ambulatory Visit: Payer: Self-pay

## 2023-07-05 ENCOUNTER — Encounter (HOSPITAL_COMMUNITY): Payer: Self-pay | Admitting: Emergency Medicine

## 2023-07-05 ENCOUNTER — Emergency Department (HOSPITAL_COMMUNITY)

## 2023-07-05 ENCOUNTER — Inpatient Hospital Stay (HOSPITAL_COMMUNITY)
Admission: EM | Admit: 2023-07-05 | Discharge: 2023-07-12 | DRG: 444 | Disposition: A | Discharge status: Home / Self Care | Attending: Internal Medicine | Admitting: Internal Medicine

## 2023-07-05 DIAGNOSIS — Z79899 Other long term (current) drug therapy: Secondary | ICD-10-CM

## 2023-07-05 DIAGNOSIS — F32A Depression, unspecified: Secondary | ICD-10-CM | POA: Diagnosis present

## 2023-07-05 DIAGNOSIS — K851 Biliary acute pancreatitis without necrosis or infection: Secondary | ICD-10-CM | POA: Diagnosis not present

## 2023-07-05 DIAGNOSIS — R5381 Other malaise: Secondary | ICD-10-CM | POA: Diagnosis present

## 2023-07-05 DIAGNOSIS — Z885 Allergy status to narcotic agent status: Secondary | ICD-10-CM

## 2023-07-05 DIAGNOSIS — R000 Tachycardia, unspecified: Secondary | ICD-10-CM | POA: Diagnosis present

## 2023-07-05 DIAGNOSIS — J9601 Acute respiratory failure with hypoxia: Secondary | ICD-10-CM | POA: Diagnosis present

## 2023-07-05 DIAGNOSIS — Z833 Family history of diabetes mellitus: Secondary | ICD-10-CM

## 2023-07-05 DIAGNOSIS — K900 Celiac disease: Secondary | ICD-10-CM | POA: Diagnosis present

## 2023-07-05 DIAGNOSIS — J918 Pleural effusion in other conditions classified elsewhere: Secondary | ICD-10-CM | POA: Diagnosis present

## 2023-07-05 DIAGNOSIS — Z6836 Body mass index (BMI) 36.0-36.9, adult: Secondary | ICD-10-CM

## 2023-07-05 DIAGNOSIS — Z823 Family history of stroke: Secondary | ICD-10-CM

## 2023-07-05 DIAGNOSIS — G4700 Insomnia, unspecified: Secondary | ICD-10-CM | POA: Diagnosis present

## 2023-07-05 DIAGNOSIS — K8010 Calculus of gallbladder with chronic cholecystitis without obstruction: Principal | ICD-10-CM | POA: Diagnosis present

## 2023-07-05 DIAGNOSIS — E66812 Obesity, class 2: Secondary | ICD-10-CM | POA: Diagnosis present

## 2023-07-05 DIAGNOSIS — J9811 Atelectasis: Secondary | ICD-10-CM | POA: Diagnosis present

## 2023-07-05 DIAGNOSIS — Z5309 Procedure and treatment not carried out because of other contraindication: Secondary | ICD-10-CM | POA: Diagnosis present

## 2023-07-05 DIAGNOSIS — I3139 Other pericardial effusion (noninflammatory): Secondary | ICD-10-CM | POA: Diagnosis present

## 2023-07-05 DIAGNOSIS — J189 Pneumonia, unspecified organism: Secondary | ICD-10-CM | POA: Diagnosis present

## 2023-07-05 DIAGNOSIS — R0902 Hypoxemia: Secondary | ICD-10-CM

## 2023-07-05 DIAGNOSIS — E876 Hypokalemia: Secondary | ICD-10-CM | POA: Diagnosis not present

## 2023-07-05 DIAGNOSIS — Z8249 Family history of ischemic heart disease and other diseases of the circulatory system: Secondary | ICD-10-CM

## 2023-07-05 DIAGNOSIS — Z825 Family history of asthma and other chronic lower respiratory diseases: Secondary | ICD-10-CM

## 2023-07-05 DIAGNOSIS — K219 Gastro-esophageal reflux disease without esophagitis: Secondary | ICD-10-CM | POA: Diagnosis present

## 2023-07-05 LAB — BLOOD GAS, ARTERIAL
Acid-Base Excess: 9.4 mmol/L — ABNORMAL HIGH (ref 0.0–2.0)
Bicarbonate: 34.8 mmol/L — ABNORMAL HIGH (ref 20.0–28.0)
Drawn by: 23281
O2 Saturation: 90.1 %
Patient temperature: 37.6
pCO2 arterial: 50 mmHg — ABNORMAL HIGH (ref 32–48)
pH, Arterial: 7.45 (ref 7.35–7.45)
pO2, Arterial: 61 mmHg — ABNORMAL LOW (ref 83–108)

## 2023-07-05 LAB — CBC WITH DIFFERENTIAL/PLATELET
Abs Immature Granulocytes: 0.06 10*3/uL (ref 0.00–0.07)
Basophils Absolute: 0 10*3/uL (ref 0.0–0.1)
Basophils Relative: 0 %
Eosinophils Absolute: 0.1 10*3/uL (ref 0.0–0.5)
Eosinophils Relative: 0 %
HCT: 46.7 % — ABNORMAL HIGH (ref 36.0–46.0)
Hemoglobin: 14.9 g/dL (ref 12.0–15.0)
Immature Granulocytes: 0 %
Lymphocytes Relative: 13 %
Lymphs Abs: 2.2 10*3/uL (ref 0.7–4.0)
MCH: 29.3 pg (ref 26.0–34.0)
MCHC: 31.9 g/dL (ref 30.0–36.0)
MCV: 91.9 fL (ref 80.0–100.0)
Monocytes Absolute: 1 10*3/uL (ref 0.1–1.0)
Monocytes Relative: 6 %
Neutro Abs: 13.1 10*3/uL — ABNORMAL HIGH (ref 1.7–7.7)
Neutrophils Relative %: 81 %
Platelets: 226 10*3/uL (ref 150–400)
RBC: 5.08 MIL/uL (ref 3.87–5.11)
RDW: 14 % (ref 11.5–15.5)
WBC: 16.5 10*3/uL — ABNORMAL HIGH (ref 4.0–10.5)
nRBC: 0 % (ref 0.0–0.2)

## 2023-07-05 LAB — COMPREHENSIVE METABOLIC PANEL WITH GFR
ALT: 282 U/L — ABNORMAL HIGH (ref 0–44)
AST: 44 U/L — ABNORMAL HIGH (ref 15–41)
Albumin: 3.6 g/dL (ref 3.5–5.0)
Alkaline Phosphatase: 103 U/L (ref 38–126)
Anion gap: 12 (ref 5–15)
BUN: 6 mg/dL (ref 6–20)
CO2: 28 mmol/L (ref 22–32)
Calcium: 9 mg/dL (ref 8.9–10.3)
Chloride: 98 mmol/L (ref 98–111)
Creatinine, Ser: 0.73 mg/dL (ref 0.44–1.00)
GFR, Estimated: >60 mL/min (ref >60)
Glucose, Bld: 106 mg/dL — ABNORMAL HIGH (ref 70–99)
Potassium: 3.6 mmol/L (ref 3.5–5.1)
Sodium: 138 mmol/L (ref 135–145)
Total Bilirubin: 1.2 mg/dL (ref 0.0–1.2)
Total Protein: 7.6 g/dL (ref 6.5–8.1)

## 2023-07-05 LAB — URINALYSIS, ROUTINE W REFLEX MICROSCOPIC
Bilirubin Urine: NEGATIVE
Glucose, UA: NEGATIVE mg/dL
Ketones, ur: 20 mg/dL — AB
Leukocytes,Ua: NEGATIVE
Nitrite: NEGATIVE
Protein, ur: 30 mg/dL — AB
Specific Gravity, Urine: 1.015 (ref 1.005–1.030)
pH: 5 (ref 5.0–8.0)

## 2023-07-05 LAB — LIPASE, BLOOD: Lipase: 74 U/L — ABNORMAL HIGH (ref 11–51)

## 2023-07-05 MED ORDER — SERTRALINE HCL 50 MG PO TABS
50.0000 mg | ORAL_TABLET | Freq: Every day | ORAL | Status: DC
  Administered 2023-07-06 – 2023-07-12 (×7): 50 mg via ORAL
  Filled 2023-07-05 (×7): qty 1

## 2023-07-05 MED ORDER — MORPHINE SULFATE (PF) 4 MG/ML IV SOLN
4.0000 mg | INTRAVENOUS | Status: DC | PRN
  Administered 2023-07-05 – 2023-07-10 (×6): 4 mg via INTRAVENOUS
  Filled 2023-07-05 (×6): qty 1

## 2023-07-05 MED ORDER — ONDANSETRON HCL 4 MG/2ML IJ SOLN
4.0000 mg | Freq: Four times a day (QID) | INTRAMUSCULAR | Status: DC | PRN
  Administered 2023-07-06 – 2023-07-08 (×4): 4 mg via INTRAVENOUS
  Filled 2023-07-05 (×6): qty 2

## 2023-07-05 MED ORDER — ACETAMINOPHEN 325 MG PO TABS
650.0000 mg | ORAL_TABLET | Freq: Four times a day (QID) | ORAL | Status: DC | PRN
  Administered 2023-07-10: 650 mg via ORAL
  Filled 2023-07-05: qty 2

## 2023-07-05 MED ORDER — SODIUM CHLORIDE 0.9 % IV SOLN
2.0000 g | Freq: Once | INTRAVENOUS | Status: AC
  Administered 2023-07-05: 2 g via INTRAVENOUS
  Filled 2023-07-05: qty 20

## 2023-07-05 MED ORDER — PIPERACILLIN-TAZOBACTAM 3.375 G IVPB
3.3750 g | Freq: Three times a day (TID) | INTRAVENOUS | Status: DC
  Administered 2023-07-06 – 2023-07-12 (×20): 3.375 g via INTRAVENOUS
  Filled 2023-07-05 (×23): qty 50

## 2023-07-05 MED ORDER — LACTATED RINGERS IV SOLN: INTRAVENOUS | Status: AC

## 2023-07-05 MED ORDER — PANTOPRAZOLE SODIUM 40 MG PO TBEC
40.0000 mg | DELAYED_RELEASE_TABLET | Freq: Every day | ORAL | Status: DC
  Administered 2023-07-06: 40 mg via ORAL
  Filled 2023-07-05: qty 1

## 2023-07-05 MED ORDER — VANCOMYCIN HCL 2000 MG/400ML IV SOLN
2000.0000 mg | Freq: Once | INTRAVENOUS | Status: AC
  Administered 2023-07-05: 2000 mg via INTRAVENOUS
  Filled 2023-07-05: qty 400

## 2023-07-05 MED ORDER — ACETAMINOPHEN 650 MG RE SUPP: 650.0000 mg | Freq: Four times a day (QID) | RECTAL | Status: DC | PRN

## 2023-07-05 MED ORDER — MORPHINE SULFATE (PF) 4 MG/ML IV SOLN
8.0000 mg | Freq: Once | INTRAVENOUS | Status: AC
  Administered 2023-07-05: 8 mg via INTRAVENOUS
  Filled 2023-07-05: qty 2

## 2023-07-05 MED ORDER — IOHEXOL 350 MG/ML SOLN
75.0000 mL | Freq: Once | INTRAVENOUS | Status: AC | PRN
  Administered 2023-07-05: 75 mL via INTRAVENOUS

## 2023-07-05 MED ORDER — ONDANSETRON HCL 4 MG PO TABS
4.0000 mg | ORAL_TABLET | Freq: Four times a day (QID) | ORAL | Status: DC | PRN
Start: 2023-07-05 — End: 2023-07-12

## 2023-07-05 MED ORDER — HYDROCODONE-ACETAMINOPHEN 5-325 MG PO TABS
1.0000 | ORAL_TABLET | ORAL | Status: DC | PRN
  Administered 2023-07-06 – 2023-07-08 (×9): 2 via ORAL
  Administered 2023-07-08 (×2): 1 via ORAL
  Administered 2023-07-08 – 2023-07-12 (×12): 2 via ORAL
  Filled 2023-07-05 (×5): qty 2
  Filled 2023-07-05: qty 1
  Filled 2023-07-05 (×3): qty 2
  Filled 2023-07-05: qty 1
  Filled 2023-07-05 (×8): qty 2
  Filled 2023-07-05 (×2): qty 1
  Filled 2023-07-05 (×5): qty 2

## 2023-07-05 NOTE — ED Notes (Signed)
 ED TO INPATIENT HANDOFF REPORT  Name/Age/Gender Chloe Rice 28 y.o. female  Code Status    Code Status Orders  (From admission, onward)           Start     Ordered   07/05/23 2236  Full code  Continuous       Question:  By:  Answer:  Consent: discussion documented in EHR   07/05/23 2235           Code Status History     This patient has a current code status but no historical code status.       Home/SNF/Other Home  Chief Complaint Acute gallstone pancreatitis [K85.10]  Level of Care/Admitting Diagnosis ED Disposition     ED Disposition  Admit   Condition  --   Comment  Hospital Area: [facility] [[id number]]  Level of Care: Med-Surg [16]  May place patient in observation at [facility] or [facility] if equivalent level of care is available:: No  Covid Evaluation: Asymptomatic - no recent exposure (last 10 days) testing not required  Diagnosis: Acute gallstone pancreatitis [7829562]  Admitting Physician: Alan Mulder [[id number]]  Attending Physician: Alan Mulder [[id number]]          Medical History Past Medical History:  Diagnosis Date   Celiac disease since age 79   Heart murmur    as an infant, mother states she was "cleared" at age 41 mos.   Seizure [facility]) age 44   x 1; unknown cause, was not on anticonvulsant   Seizures (HCC) age 18 mos.   febrile x 2   Superficial swelling of scalp 06/2012   benign nodular swelling left occipital scalp    Allergies Allergies  Allergen Reactions   Gluten Meal Other (See Comments)    CELIAC DISEASE and Abdominal Pain   Oxycodone Anxiety and Other (See Comments)    Uncontrollable shaking (advised not to consume) and muscle spasms, also    IV Location/Drains/Wounds Patient Lines/Drains/Airways Status     Active Line/Drains/Airways     Name Placement date Placement time Site Days   Peripheral IV 07/05/23 20 G 1" Left Antecubital 07/05/23  1814  Antecubital  less than 1             Labs/Imaging Results for orders placed or performed during the hospital encounter of 07/05/23 (from the past 48 hours)  Urinalysis, Routine w reflex microscopic -Urine, Clean Catch     Status: Abnormal   Collection Time: 07/05/23  6:14 PM  Result Value Ref Range   Color, Urine YELLOW YELLOW   APPearance HAZY (A) CLEAR   Specific Gravity, Urine 1.015 1.005 - 1.030   pH 5.0 5.0 - 8.0   Glucose, UA NEGATIVE NEGATIVE mg/dL   Hgb urine dipstick LARGE (A) NEGATIVE   Bilirubin Urine NEGATIVE NEGATIVE   Ketones, ur 20 (A) NEGATIVE mg/dL   Protein, ur 30 (A) NEGATIVE mg/dL   Nitrite NEGATIVE NEGATIVE   Leukocytes,Ua NEGATIVE NEGATIVE   RBC / HPF 0-5 0 - 5 RBC/hpf   WBC, UA 0-5 0 - 5 WBC/hpf   Bacteria, UA MANY (A) NONE SEEN   Squamous Epithelial / HPF 6-10 0 - 5 /HPF   Mucus PRESENT     Comment: Performed at [facility], [address] [place], [place] [address]  CBC with Differential     Status: Abnormal   Collection Time: 07/05/23  6:40 PM  Result Value Ref Range   WBC 16.5 (H) 4.0 -  10.5 K/uL   RBC 5.08 3.87 - 5.11 MIL/uL   Hemoglobin 14.9 12.0 - 15.0 g/dL   HCT 16.1 (H) 09.6 - 04.5 %   MCV 91.9 80.0 - 100.0 fL   MCH 29.3 26.0 - 34.0 pg   MCHC 31.9 30.0 - 36.0 g/dL   RDW 40.9 81.1 - 91.4 %   Platelets 226 150 - 400 K/uL   nRBC 0.0 0.0 - 0.2 %   Neutrophils Relative % 81 %   Neutro Abs 13.1 (H) 1.7 - 7.7 K/uL   Lymphocytes Relative 13 %   Lymphs Abs 2.2 0.7 - 4.0 K/uL   Monocytes Relative 6 %   Monocytes Absolute 1.0 0.1 - 1.0 K/uL   Eosinophils Relative 0 %   Eosinophils Absolute 0.1 0.0 - 0.5 K/uL   Basophils Relative 0 %   Basophils Absolute 0.0 0.0 - 0.1 K/uL   Immature Granulocytes 0 %   Abs Immature Granulocytes 0.06 0.00 - 0.07 K/uL    Comment: Performed at [facility], [address], [place], [place] [address]  Lipase, blood     Status: Abnormal   Collection Time: 07/05/23  6:40 PM  Result Value Ref Range    Lipase 74 (H) 11 - 51 U/L    Comment: Performed at [facility], [address], [place], [place] [address]  Comprehensive metabolic panel     Status: Abnormal   Collection Time: 07/05/23  6:40 PM  Result Value Ref Range   Sodium 138 135 - 145 mmol/L   Potassium 3.6 3.5 - 5.1 mmol/L   Chloride 98 98 - 111 mmol/L   CO2 28 22 - 32 mmol/L   Glucose, Bld 106 (H) 70 - 99 mg/dL    Comment: Glucose reference range applies only to samples taken after fasting for at least 8 hours.   BUN 6 6 - 20 mg/dL   Creatinine, Ser 8.65 0.44 - 1.00 mg/dL   Calcium 9.0 8.9 - 78.4 mg/dL   Total Protein 7.6 6.5 - 8.1 g/dL   Albumin 3.6 3.5 - 5.0 g/dL   AST 44 (H) 15 - 41 U/L   ALT 282 (H) 0 - 44 U/L   Alkaline Phosphatase 103 38 - 126 U/L   Total Bilirubin 1.2 0.0 - 1.2 mg/dL   GFR, Estimated >69 >62 mL/min    Comment: (NOTE) Calculated using the CKD-EPI Creatinine Equation (2021)    Anion gap 12 5 - 15    Comment: Performed at [facility], [address], [place], [place] [address]  Blood gas, arterial (at [facility])     Status: Abnormal   Collection Time: 07/05/23  9:00 PM  Result Value Ref Range   O2 Content ROOM AIR L/min   Delivery systems ROOM AIR    pH, Arterial 7.45 7.35 - 7.45   pCO2 arterial 50 (H) 32 - 48 mmHg   pO2, Arterial 61 (L) 83 - 108 mmHg   Bicarbonate 34.8 (H) 20.0 - 28.0 mmol/L   Acid-Base Excess 9.4 (H) 0.0 - 2.0 mmol/L   O2 Saturation 90.1 %   Patient temperature 37.6    Collection site RIGHT RADIAL    Drawn by [id number]    Allens test (pass/fail) PASS PASS    Comment: Performed at [facility], [address] [place], [place] [address]   US Abdomen Limited RUQ (LIVER/GB) Result Date: 07/05/2023 CLINICAL DATA:  Pancreatitis EXAM: ULTRASOUND ABDOMEN LIMITED RIGHT UPPER QUADRANT COMPARISON:  CT 07/03/2023 FINDINGS: Gallbladder: Stones and sludge within the gallbladder. Stones measure up to 6 mm. Wall thickening measuring 4  mm. Negative sonographic Murphy sign. Common bile duct: Diameter: Normal caliber, 3 mm. Liver: No focal lesion identified. Within normal limits in parenchymal echogenicity. Portal vein is patent on color Doppler imaging with normal direction of blood flow towards the liver. Other: None. IMPRESSION: Sludge and stones within the gallbladder. Mild gallbladder wall thickening. Appearance is concerning for possible cholecystitis. Recommend clinical correlation. Electronically Signed   By: Charlett Nose M.D.   On: 07/05/2023 20:13   DG Chest 2 View Result Date: 07/05/2023 CLINICAL DATA:  Shortness of breath.  Acute pancreatitis. EXAM: CHEST - 2 VIEW COMPARISON:  07/02/2023 FINDINGS: The heart size and mediastinal contours are within normal limits. Low lung volumes are now seen with development of bibasilar atelectasis. Probable small left pleural effusion also noted. IMPRESSION: Bibasilar atelectasis, and probable small left pleural effusion. Electronically Signed   By: Danae Orleans M.D.   On: 07/05/2023 20:05    Pending Labs Unresulted Labs (From admission, onward)     Start     Ordered   07/06/23 0500  Comprehensive metabolic panel  Tomorrow morning,   R        07/05/23 2235   07/06/23 0500  CBC  Tomorrow morning,   R        07/05/23 2235   07/06/23 0500  Protime-INR  Tomorrow morning,   R        07/05/23 2235   07/05/23 2105  Urine Culture  Once,   URGENT       Question:  Indication  Answer:  Acute gross hematuria   07/05/23 2104            Vitals/Pain Today's Vitals   07/05/23 2151 07/05/23 2154 07/05/23 2157 07/05/23 2158  BP: 115/85     Pulse: (!) 117     Resp: 18     Temp:   97.9 F (36.6 C)   TempSrc:   Oral   SpO2: 94%     Weight:      Height:      PainSc:  7   7     Isolation Precautions No active isolations  Medications Medications  morphine (PF) 4 MG/ML injection 4 mg (4 mg Intravenous Given 07/05/23 2158)  sertraline (ZOLOFT) tablet 50 mg (has no administration in  time range)  pantoprazole (PROTONIX) EC tablet 40 mg (has no administration in time range)  acetaminophen (TYLENOL) tablet 650 mg (has no administration in time range)    Or  acetaminophen (TYLENOL) suppository 650 mg (has no administration in time range)  HYDROcodone-acetaminophen (NORCO/VICODIN) 5-325 MG per tablet 1-2 tablet (has no administration in time range)  ondansetron (ZOFRAN) tablet 4 mg (has no administration in time range)    Or  ondansetron (ZOFRAN) injection 4 mg (has no administration in time range)  lactated ringers infusion (has no administration in time range)  morphine (PF) 4 MG/ML injection 8 mg (8 mg Intravenous Given 07/05/23 1847)  cefTRIAXone (ROCEPHIN) 2 g in sodium chloride 0.9 % 100 mL IVPB (0 g Intravenous Stopped 07/05/23 2130)  iohexol (OMNIPAQUE) 350 MG/ML injection 75 mL (75 mLs Intravenous Contrast Given 07/05/23 2218)    Mobility walks with device

## 2023-07-05 NOTE — Progress Notes (Signed)
 Notified lab Victorino Dike) that abg sample sent for processing.

## 2023-07-05 NOTE — H&P (Addendum)
 History and Physical    Chloe Rice ZOX:[id number] DOB: June 03, 1995 DOA: 07/05/2023  PCP: [facility] [place]   Chief Complaint: Abdominal pain  HPI: Chloe Rice is a 28 y.o. female with medical history significant of paresthesia, celiac who presented to the emergency department with abdominal pain.  Patient been having worsening pain since last Sunday when she presented to the ER.  She was found to have pancreatitis and was given pain relief.  She was eventually discharged however his symptoms fail to improve.  She describes her abdominal pain as epigastric in origin and did not have any other infectious complaints.  On arrival to emergency department she was febrile here dynamically stable.  Labs were obtained which demonstrated urinalysis with possible infection, WBC 16.5, hemoglobin 14.9, lipase 74 from 4802 days ago AST 44, ALT 282, blood gas showed pH 7.45.  Patient underwent ultrasound abdomen which showed concern for cholecystitis with no dilated common bile duct.  Chest x-ray showed atelectasis.  CTA pulm embolism study was ordered due to concern for PE in setting of inflammatory state.  Patient was sitting in the emergency department was found to be hypoxic in the 80s on room air without increased work of breathing.  She denies prior lung problems in the past she does not smoke.  She does not drink alcohol and has a family history of pancreatitis   Review of Systems: Review of Systems  Constitutional:  Positive for malaise/fatigue. Negative for chills and fever.  HENT: Negative.    Eyes: Negative.   Respiratory: Negative.    Cardiovascular: Negative.   Gastrointestinal:  Positive for abdominal pain.  Genitourinary: Negative.   Musculoskeletal: Negative.   Skin: Negative.   Neurological: Negative.   Endo/Heme/Allergies: Negative.   Psychiatric/Behavioral: Negative.       As per HPI otherwise 10 point review of systems negative.   Allergies  Allergen Reactions    Gluten Meal Other (See Comments)    CELIAC DISEASE and Abdominal Pain   Oxycodone Anxiety and Other (See Comments)    Uncontrollable shaking (advised not to consume) and muscle spasms, also    Past Medical History:  Diagnosis Date   Celiac disease since age 72   Heart murmur    as an infant, mother states she was "cleared" at age 52 mos.   Seizure [facility]) age 66   x 1; unknown cause, was not on anticonvulsant   Seizures (HCC) age 78 mos.   febrile x 2   Superficial swelling of scalp 06/2012   benign nodular swelling left occipital scalp    Past Surgical History:  Procedure Laterality Date   ARM HARDWARE REMOVAL Right 2013   MASS EXCISION Left 06/14/2012   Procedure: EXCISION OF BENIGN OCCIPTAL SCALP NODULE;  Surgeon: Judie Petit. Leonia Corona, MD;  Location: [facility];  Service: Pediatrics;  Laterality: Left;   OSTEOTOMY AND ULNAR SHORTENING Right 2012     reports that she has never smoked. She has never used smokeless tobacco. She reports current alcohol use. She reports that she does not use drugs.  Family History  Problem Relation Age of Onset   Heart disease Maternal Grandmother        MI   Kidney disease Maternal Grandmother        was on dialysis   Stroke Paternal Grandmother    Heart disease Paternal Grandfather    Asthma Mother    Hypertension Father        treating with diet and  exercise   Diabetes Maternal Uncle        juvenile    Prior to Admission medications   Medication Sig Start Date End Date Taking? Authorizing Provider  HYDROcodone-acetaminophen (NORCO/VICODIN) 5-325 MG tablet Take 1 tablet by mouth every 4 (four) hours as needed. Patient taking differently: Take 1 tablet by mouth every 4 (four) hours as needed (for pain). 07/03/23  Yes Dione Booze, MD  hydrOXYzine (ATARAX) 50 MG tablet Take 25-50 mg by mouth daily as needed (for sleep or panic).   Yes [provider]  ondansetron (ZOFRAN-ODT) 4 MG disintegrating tablet Take 1 tablet  (4 mg total) by mouth every 8 (eight) hours as needed for nausea or vomiting. Patient taking differently: Take 4 mg by mouth every 8 (eight) hours as needed for nausea or vomiting (dissolve orally). 07/03/23  Yes Dione Booze, MD  pantoprazole (PROTONIX) 40 MG tablet Take 1 tablet (40 mg total) by mouth daily. 07/03/23  Yes Dione Booze, MD  sertraline (ZOLOFT) 50 MG tablet Take 50 mg by mouth daily.   Yes [provider]  SLYND 4 MG TABS Take 4 mg by mouth daily.   Yes [provider]  HYDROcodone-homatropine (HYCODAN) 5-1.5 MG/5ML syrup Take 5 mLs by mouth every 6 (six) hours as needed for cough. Patient not taking: Reported on 07/05/2023 06/13/18   Mardella Layman, MD  medroxyPROGESTERone (DEPO-PROVERA) 150 MG/ML injection INJECT 1 ML INTO THE MUSCLE AS DIRECTED Patient not taking: Reported on 07/05/2023 04/01/20 07/05/23  Farris Has, MD    Physical Exam: Vitals:   07/05/23 2015 07/05/23 2056 07/05/23 2151 07/05/23 2157  BP: 119/75  115/85   Pulse: (!) 106  (!) 117   Resp: 17  18   Temp:    97.9 F (36.6 C)  TempSrc:    Oral  SpO2: 93% 93% 94%   Weight:      Height:       Physical Exam Constitutional:      Appearance: She is normal weight.  HENT:     Head: Normocephalic.     Mouth/Throat:     Mouth: Mucous membranes are moist.  Cardiovascular:     Rate and Rhythm: Tachycardia present.  Abdominal:     General: Bowel sounds are normal.     Palpations: Abdomen is soft.     Tenderness: There is abdominal tenderness.  Genitourinary:    Rectum: Normal.  Skin:    Capillary Refill: Capillary refill takes less than 2 seconds.  Neurological:     General: No focal deficit present.     Mental Status: She is alert.  Psychiatric:        Mood and Affect: Mood normal.       Labs on Admission: I have personally reviewed the patients's labs and imaging studies.  Assessment/Plan Principal Problem:   Acute gallstone pancreatitis   # Acute gallstone pancreatitis -  Patient presents abdominal pain initially found of lipase 4800 - Ultrasound shows cholecystitis/gallstones without choledocholithiasis - Surgery consulted with plans for possible surgical intervention - Elevated LFTs consistent with passed stone Plan: Continue n.p.o. Continue fluids Appreciate surgical commendations  # GERD-continue Protonix  # Depression-continue Zoloft  # Acute hypoxic respiratory failure most likely secondary to atelectasis - Patient found to have hypoxia in the 80s with no increased work of breathing.  Etiology unclear  Plan: Order CTA pulm illness of the study to further assess etiology of hypoxia and encourage incentive spirometry.  Admission status: Observation Med-Surg  Certification: The appropriate  patient status for this patient is OBSERVATION. Observation status is judged to be reasonable and necessary in order to provide the required intensity of service to ensure the patient's safety. The patient's presenting symptoms, physical exam findings, and initial radiographic and laboratory data in the context of their medical condition is felt to place them at decreased risk for further clinical deterioration. Furthermore, it is anticipated that the patient will be medically stable for discharge from the hospital within 2 midnights of admission.     Alan Mulder MD [facility] If 7PM-7AM, please contact night-coverage [email or web address]  07/05/2023, 10:36 PM

## 2023-07-05 NOTE — ED Provider Notes (Signed)
 [place] EMERGENCY DEPARTMENT AT [facility] Provider Note   CSN: [id number] Arrival date & time: 07/05/23  1745     History  Chief Complaint  Patient presents with   Abdominal Pain   Nausea    Chloe Rice is a 28 y.o. female.  HPI     Pt comes in with cc of persistent abd pain, and nausea. She has a history of celiac disease.  She started having some abdominal pain over the weekend.  She had come to the ER late Sunday night because of worsening pain.  Patient was found to have pancreatitis.  She indicates that the morphine did give her some pain relief, when given the option, she did opt to go home with pain meds.  She has been taking pain medicine, initially it was helping, but now the pain is persistent and constant.  Pain is primarily in the upper quadrants of the abdomen, interscapular region and it is worse with breathing.  Patient denies any associated fevers or chills.  She has nausea, but no emesis.  She has no appetite and has not had anything to eat.  Patient noted to have O2 sats in the 80s.  Patient indicates that she has no underlying lung disease.  She has no history of PE, DVT.  She is currently on nonestrogen birth control.  No recent long distance travels or major surgeries.  Patient denies any cough.  No history of cardiac disease.  Home Medications Prior to Admission medications   Medication Sig Start Date End Date Taking? Authorizing Provider  albuterol (VENTOLIN HFA) 108 (90 Base) MCG/ACT inhaler Inhale 2 puffs into the lungs every 6 (six) hours as needed for wheezing or shortness of breath.   Yes [provider]  HYDROcodone-acetaminophen (NORCO/VICODIN) 5-325 MG tablet Take 1 tablet by mouth every 4 (four) hours as needed. 07/03/23   Dione Booze, MD  HYDROcodone-homatropine [facility]) 5-1.5 MG/5ML syrup Take 5 mLs by mouth every 6 (six) hours as needed for cough. 06/13/18   Mardella Layman, MD  ibuprofen (ADVIL,MOTRIN) 800 MG tablet Take 800 mg  by mouth every 8 (eight) hours as needed.    [provider]  medroxyPROGESTERone (DEPO-PROVERA) 150 MG/ML injection INJECT 1 ML INTO THE MUSCLE AS DIRECTED 04/01/20 07/05/23  Farris Has, MD  mometasone [facility]) 220 MCG/INH inhaler Inhale 2 puffs into the lungs daily.    [provider]  ondansetron (ZOFRAN-ODT) 4 MG disintegrating tablet Take 1 tablet (4 mg total) by mouth every 8 (eight) hours as needed for nausea or vomiting. 07/03/23   Dione Booze, MD  pantoprazole (PROTONIX) 40 MG tablet Take 1 tablet (40 mg total) by mouth daily. 07/03/23   Dione Booze, MD  Pseudoephedrine-APAP-DM (DAYQUIL PO) Take by mouth.    [provider]      Allergies    Gluten meal, Oxycodone, and Hydrocodone    Review of Systems   Review of Systems  All other systems reviewed and are negative.   Physical Exam Updated Vital Signs BP 119/75 (BP Location: Left Arm)   Pulse (!) 106   Temp 98.2 F (36.8 C) (Oral)   Resp 17   Ht 5\' 2"  (1.575 m)   Wt 90 kg   LMP 04/19/2023 (Approximate)   SpO2 93%   BMI 36.29 kg/m  Physical Exam Vitals and nursing note reviewed.  Constitutional:      Appearance: She is well-developed.  HENT:     Head: Atraumatic.  Eyes:  General: No scleral icterus. Cardiovascular:     Rate and Rhythm: Tachycardia present.  Pulmonary:     Effort: Pulmonary effort is normal.     Breath sounds: No wheezing or rhonchi.     Comments: Bibasilar rales Abdominal:     Tenderness: There is abdominal tenderness in the right upper quadrant and epigastric area. There is guarding. Positive signs include Murphy's sign.  Musculoskeletal:     Cervical back: Normal range of motion and neck supple.  Skin:    General: Skin is warm and dry.     Comments: No evidence of lower extremity edema or unilateral swelling  Neurological:     Mental Status: She is alert and oriented to person, place, and time.     ED Results / Procedures / Treatments   Labs (all labs  ordered are listed, but only abnormal results are displayed) Labs Reviewed  URINALYSIS, ROUTINE W REFLEX MICROSCOPIC - Abnormal; Notable for the following components:      Result Value   APPearance HAZY (*)    Hgb urine dipstick LARGE (*)    Ketones, ur 20 (*)    Protein, ur 30 (*)    Bacteria, UA MANY (*)    All other components within normal limits  CBC WITH DIFFERENTIAL/PLATELET - Abnormal; Notable for the following components:   WBC 16.5 (*)    HCT 46.7 (*)    Neutro Abs 13.1 (*)    All other components within normal limits  LIPASE, BLOOD - Abnormal; Notable for the following components:   Lipase 74 (*)    All other components within normal limits  COMPREHENSIVE METABOLIC PANEL WITH GFR - Abnormal; Notable for the following components:   Glucose, Bld 106 (*)    AST 44 (*)    ALT 282 (*)    All other components within normal limits  BLOOD GAS, ARTERIAL    EKG None  Radiology US Abdomen Limited RUQ (LIVER/GB) Result Date: 07/05/2023 CLINICAL DATA:  Pancreatitis EXAM: ULTRASOUND ABDOMEN LIMITED RIGHT UPPER QUADRANT COMPARISON:  CT 07/03/2023 FINDINGS: Gallbladder: Stones and sludge within the gallbladder. Stones measure up to 6 mm. Wall thickening measuring 4 mm. Negative sonographic Murphy sign. Common bile duct: Diameter: Normal caliber, 3 mm. Liver: No focal lesion identified. Within normal limits in parenchymal echogenicity. Portal vein is patent on color Doppler imaging with normal direction of blood flow towards the liver. Other: None. IMPRESSION: Sludge and stones within the gallbladder. Mild gallbladder wall thickening. Appearance is concerning for possible cholecystitis. Recommend clinical correlation. Electronically Signed   By: Charlett Nose M.D.   On: 07/05/2023 20:13   DG Chest 2 View Result Date: 07/05/2023 CLINICAL DATA:  Shortness of breath.  Acute pancreatitis. EXAM: CHEST - 2 VIEW COMPARISON:  07/02/2023 FINDINGS: The heart size and mediastinal contours are within  normal limits. Low lung volumes are now seen with development of bibasilar atelectasis. Probable small left pleural effusion also noted. IMPRESSION: Bibasilar atelectasis, and probable small left pleural effusion. Electronically Signed   By: Danae Orleans M.D.   On: 07/05/2023 20:05    Procedures .Critical Care  Performed by: Derwood Kaplan, MD Authorized by: Derwood Kaplan, MD   Critical care provider statement:    Critical care time (minutes):  35   Critical care was necessary to treat or prevent imminent or life-threatening deterioration of the following conditions:  Respiratory failure   Critical care was time spent personally by me on the following activities:  Development of treatment plan with patient  or surrogate, discussions with consultants, evaluation of patient's response to treatment, examination of patient, ordering and review of laboratory studies, ordering and review of radiographic studies, ordering and performing treatments and interventions, pulse oximetry, re-evaluation of patient's condition and review of old charts     Medications Ordered in ED Medications  morphine (PF) 4 MG/ML injection 8 mg (8 mg Intravenous Given 07/05/23 1847)  cefTRIAXone (ROCEPHIN) 2 g in sodium chloride 0.9 % 100 mL IVPB (2 g Intravenous New Bag/Given 07/05/23 1959)    ED Course/ Medical Decision Making/ A&P                                 Medical Decision Making Amount and/or Complexity of Data Reviewed Labs: ordered. Radiology: ordered.  Risk Prescription drug management. Decision regarding hospitalization.   This patient presents to the ED with chief complaint(s) of persistent abdominal pain and nausea with pertinent past medical history of celiac disease and recent ER visit on Sunday night when she was found to have pancreatitis.The complaint involves an extensive differential diagnosis and also carries with it a high risk of complications and morbidity.    The differential  diagnosis includes  Pancreatitis -with complication such as necrosis, Hepatobiliary pathology including cholelithiasis and cholecystitis, Gastritis/peptic ulcer disease, small bowel obstruction. For the hypoxia, the differential diagnosis includes atelectasis, pleural effusion, pneumonia, PE.  Additional history obtained: Additional history obtained from family Records reviewed  recent CT angio chest abdomen and pelvis, that revealed pancreatitis.  Also reviewed patient's labs that revealed elevated lipase at over 4000 and elevated LFTs, elevated bilirubin.   The initial plan is to get repeat labs including lipase and also ultrasound abdomen right upper quadrant. Clinically it appears that patient likely has gallstone pancreatitis.  Independent labs interpretation:  The following labs were independently interpreted: CMP and lipase are better today.  CBC reveals elevated white count of 16,000.  Independent visualization and interpretation of imaging: - I independently visualized the following imaging with scope of interpretation limited to determining acute life threatening conditions related to emergency care: X-ray of the chest and ultrasound of the right upper quadrant, which revealed evidence of atelectasis and gallbladder wall thickening with gallstone and sludge.  I am concerned for cholecystitis.  I will consult general surgery.  I think the hypoxia is because of the atelectasis and perhaps the effusion.  The pleural effusion likely because of the pancreatitis.  I do not think CT PE is indicated at this time.  Treatment and Reassessment: Results of the ED workup discussed with the patient. She aware that general surgery will see her.  She is aware that antibiotics will be initiated. I also discussed with her the x-ray findings and my concerns of atelectasis causing hypoxia.  Medicine team does want an ABG, which has been ordered.  We will give her an incentive spirometer.  She has been  placed on nasal cannula 2-4 liters to maintain O2 sats over 92%. Additionally, patient informs me that she has had some spotting.  UA does have evidence of likely UTI.  Consultation: - Consulted or discussed management/test interpretation with external professional: General surgery.  Dr. Luisa Hart will see the patient.  He is comfortable with patient receiving ceftriaxone.    Final Clinical Impression(s) / ED Diagnoses Final diagnoses:  Gall stone pancreatitis  Hypoxia  Atelectasis    Rx / DC Orders ED Discharge Orders     None  Derwood Kaplan, MD 07/05/23 2104

## 2023-07-05 NOTE — ED Notes (Signed)
 Patient oxygen drop to mid 80s while RN and MD were at bedside MD applied patient on O2 via [place] on 2L.

## 2023-07-05 NOTE — Progress Notes (Addendum)
 Pharmacy Antibiotic Note  Chloe Rice is a 28 y.o. female admitted on 07/05/2023 with acute gallstone pancreatitis .  Pharmacy has been consulted for vancomycin dosing.  Plan: Vancomycin 2gm IV x 1 then 1750mg  IV q24h (AUC 483.8, Scr 0.73) Follow renal function, cultures and clinical course  Height: 5\' 2"  (157.5 cm) Weight: 90 kg (198 lb 6.6 oz) IBW/kg (Calculated) : 50.1  Temp (24hrs), Avg:98.3 F (36.8 C), Min:97.9 F (36.6 C), Max:98.7 F (37.1 C)  Recent Labs  Lab 07/02/23 2325 07/05/23 1840  WBC 8.3 16.5*  CREATININE 0.65 0.73    Estimated Creatinine Clearance: 110.2 mL/min (by C-G formula based on SCr of 0.73 mg/dL).    Allergies  Allergen Reactions   Gluten Meal Other (See Comments)    CELIAC DISEASE and Abdominal Pain   Oxycodone Anxiety and Other (See Comments)    Uncontrollable shaking (advised not to consume) and muscle spasms, also    Antimicrobials this admission: 4/2 CTX x1 4/3 zosyn >> 4/3 vanc >>  Dose adjustments this admission:   Microbiology results: 4/2 UCx:   Thank you for allowing pharmacy to be a part of this patient's care.  Arley Phenix RPh 07/05/2023, 11:21 PM

## 2023-07-05 NOTE — ED Triage Notes (Signed)
 Pt arrived reporting she was diagnosed with pancreatitis Sunday. Sent home with meds, not helping. States was told to come back for pain control. States has U/S scheduled with gastro on Friday

## 2023-07-05 NOTE — Consult Note (Signed)
 Reason for Consult: Gallstone pancreatitis Referring Physician: Shary Key MD  Chloe Rice is an 28 y.o. female.  HPI: 28 year old female with a 3-day history of epigastric abdominal pain.  She was seen on 07/03/2023 [facility] for epigastric pain.  She had a lipase of 4000 and concern for gallstone pancreatitis.  She chose to go home.  She presents with increasing pain.  Patient complains of pain in her epigastrium radiating to her back.  Ultrasound shows gallstones with questionable thickened gallbladder wall.  CT on 07/03/2023 shows pancreatitis.  Lipase is now 70 from over 4000.  Liver function studies have normalized.  Eating makes her pain worse.  Past Medical History:  Diagnosis Date   Celiac disease since age 38   Heart murmur    as an infant, mother states she was "cleared" at age 36 mos.   Seizure [facility]) age 74   x 1; unknown cause, was not on anticonvulsant   Seizures (HCC) age 83 mos.   febrile x 2   Superficial swelling of scalp 06/2012   benign nodular swelling left occipital scalp    Past Surgical History:  Procedure Laterality Date   ARM HARDWARE REMOVAL Right 2013   MASS EXCISION Left 06/14/2012   Procedure: EXCISION OF BENIGN OCCIPTAL SCALP NODULE;  Surgeon: Judie Petit. Leonia Corona, MD;  Location: [facility];  Service: Pediatrics;  Laterality: Left;   OSTEOTOMY AND ULNAR SHORTENING Right 2012    Family History  Problem Relation Age of Onset   Heart disease Maternal Grandmother        MI   Kidney disease Maternal Grandmother        was on dialysis   Stroke Paternal Grandmother    Heart disease Paternal Grandfather    Asthma Mother    Hypertension Father        treating with diet and exercise   Diabetes Maternal Uncle        juvenile    Social History:  reports that she has never smoked. She has never used smokeless tobacco. She reports current alcohol use. She reports that she does not use drugs.  Allergies:  Allergies  Allergen Reactions    Gluten Meal Other (See Comments)    CELIAC DISEASE  Other Reaction(s): Abdominal Pain, Other   Oxycodone Anxiety    Other reaction(s): Other Uncontrollable shaking (advised not to consume)  Muscle spasms Uncontrollable shaking (advised not to consume)  Muscle spasms Uncontrollable shaking (advised not to consume)  Muscle spasms Uncontrollable shaking (advised not to consume)  Muscle spasms    Hydrocodone     Muscle spasms    Medications: I have reviewed the patient's current medications.  Results for orders placed or performed during the hospital encounter of 07/05/23 (from the past 48 hours)  Urinalysis, Routine w reflex microscopic -Urine, Clean Catch     Status: Abnormal   Collection Time: 07/05/23  6:14 PM  Result Value Ref Range   Color, Urine YELLOW YELLOW   APPearance HAZY (A) CLEAR   Specific Gravity, Urine 1.015 1.005 - 1.030   pH 5.0 5.0 - 8.0   Glucose, UA NEGATIVE NEGATIVE mg/dL   Hgb urine dipstick LARGE (A) NEGATIVE   Bilirubin Urine NEGATIVE NEGATIVE   Ketones, ur 20 (A) NEGATIVE mg/dL   Protein, ur 30 (A) NEGATIVE mg/dL   Nitrite NEGATIVE NEGATIVE   Leukocytes,Ua NEGATIVE NEGATIVE   RBC / HPF 0-5 0 - 5 RBC/hpf   WBC, UA 0-5 0 - 5 WBC/hpf  Bacteria, UA MANY (A) NONE SEEN   Squamous Epithelial / HPF 6-10 0 - 5 /HPF   Mucus PRESENT     Comment: Performed at [facility], [address] [place], [place] [address]  CBC with Differential     Status: Abnormal   Collection Time: 07/05/23  6:40 PM  Result Value Ref Range   WBC 16.5 (H) 4.0 - 10.5 K/uL   RBC 5.08 3.87 - 5.11 MIL/uL   Hemoglobin 14.9 12.0 - 15.0 g/dL   HCT 14.7 (H) 82.9 - 56.2 %   MCV 91.9 80.0 - 100.0 fL   MCH 29.3 26.0 - 34.0 pg   MCHC 31.9 30.0 - 36.0 g/dL   RDW 13.0 86.5 - 78.4 %   Platelets 226 150 - 400 K/uL   nRBC 0.0 0.0 - 0.2 %   Neutrophils Relative % 81 %   Neutro Abs 13.1 (H) 1.7 - 7.7 K/uL   Lymphocytes Relative 13 %   Lymphs Abs 2.2 0.7 - 4.0 K/uL    Monocytes Relative 6 %   Monocytes Absolute 1.0 0.1 - 1.0 K/uL   Eosinophils Relative 0 %   Eosinophils Absolute 0.1 0.0 - 0.5 K/uL   Basophils Relative 0 %   Basophils Absolute 0.0 0.0 - 0.1 K/uL   Immature Granulocytes 0 %   Abs Immature Granulocytes 0.06 0.00 - 0.07 K/uL    Comment: Performed at [facility], [address], [place], [place] [address]  Lipase, blood     Status: Abnormal   Collection Time: 07/05/23  6:40 PM  Result Value Ref Range   Lipase 74 (H) 11 - 51 U/L    Comment: Performed at [facility], [address], [place], [place] [address]  Comprehensive metabolic panel     Status: Abnormal   Collection Time: 07/05/23  6:40 PM  Result Value Ref Range   Sodium 138 135 - 145 mmol/L   Potassium 3.6 3.5 - 5.1 mmol/L   Chloride 98 98 - 111 mmol/L   CO2 28 22 - 32 mmol/L   Glucose, Bld 106 (H) 70 - 99 mg/dL    Comment: Glucose reference range applies only to samples taken after fasting for at least 8 hours.   BUN 6 6 - 20 mg/dL   Creatinine, Ser 3.24 0.44 - 1.00 mg/dL   Calcium 9.0 8.9 - 40.1 mg/dL   Total Protein 7.6 6.5 - 8.1 g/dL   Albumin 3.6 3.5 - 5.0 g/dL   AST 44 (H) 15 - 41 U/L   ALT 282 (H) 0 - 44 U/L   Alkaline Phosphatase 103 38 - 126 U/L   Total Bilirubin 1.2 0.0 - 1.2 mg/dL   GFR, Estimated >02 >72 mL/min    Comment: (NOTE) Calculated using the CKD-EPI Creatinine Equation (2021)    Anion gap 12 5 - 15    Comment: Performed at [facility], [address], [place], [place] [address]    US Abdomen Limited RUQ (LIVER/GB) Result Date: 07/05/2023 CLINICAL DATA:  Pancreatitis EXAM: ULTRASOUND ABDOMEN LIMITED RIGHT UPPER QUADRANT COMPARISON:  CT 07/03/2023 FINDINGS: Gallbladder: Stones and sludge within the gallbladder. Stones measure up to 6 mm. Wall thickening measuring 4 mm. Negative sonographic Murphy sign. Common bile duct: Diameter: Normal caliber, 3 mm. Liver: No focal lesion identified. Within  normal limits in parenchymal echogenicity. Portal vein is patent on color Doppler imaging with normal direction of blood flow towards the liver. Other: None. IMPRESSION: Sludge  and stones within the gallbladder. Mild gallbladder wall thickening. Appearance is concerning for possible cholecystitis. Recommend clinical correlation. Electronically Signed   By: Charlett Nose M.D.   On: 07/05/2023 20:13   DG Chest 2 View Result Date: 07/05/2023 CLINICAL DATA:  Shortness of breath.  Acute pancreatitis. EXAM: CHEST - 2 VIEW COMPARISON:  07/02/2023 FINDINGS: The heart size and mediastinal contours are within normal limits. Low lung volumes are now seen with development of bibasilar atelectasis. Probable small left pleural effusion also noted. IMPRESSION: Bibasilar atelectasis, and probable small left pleural effusion. Electronically Signed   By: Danae Orleans M.D.   On: 07/05/2023 20:05    Review of Systems  Gastrointestinal:  Positive for abdominal pain, nausea and vomiting.  All other systems reviewed and are negative.  Blood pressure 119/75, pulse (!) 106, temperature 98.2 F (36.8 C), temperature source Oral, resp. rate 17, height 5\' 2"  (1.575 m), weight 90 kg, last menstrual period 04/19/2023, SpO2 93%. Physical Exam HENT:     Head: Normocephalic.  Cardiovascular:     Rate and Rhythm: Normal rate.  Pulmonary:     Effort: Pulmonary effort is normal.  Abdominal:     Palpations: Abdomen is soft.     Tenderness: There is abdominal tenderness in the epigastric area. There is no guarding or rebound. Negative signs include Murphy's sign.  Skin:    General: Skin is warm.  Neurological:     Mental Status: She is alert.     Assessment/Plan: Gallstone pancreatitis  N.p.o. for now  Cholecystectomy when pain improved  Medical admission for management of pancreatitis  Reassess in a.m. to determine surgical timing  Maisie Fus A [place] Nabor 07/05/2023, 9:12 PM   High complexity

## 2023-07-06 DIAGNOSIS — J9601 Acute respiratory failure with hypoxia: Secondary | ICD-10-CM | POA: Diagnosis present

## 2023-07-06 DIAGNOSIS — Z79899 Other long term (current) drug therapy: Secondary | ICD-10-CM | POA: Diagnosis not present

## 2023-07-06 DIAGNOSIS — K8010 Calculus of gallbladder with chronic cholecystitis without obstruction: Secondary | ICD-10-CM | POA: Diagnosis present

## 2023-07-06 DIAGNOSIS — Z5309 Procedure and treatment not carried out because of other contraindication: Secondary | ICD-10-CM | POA: Diagnosis present

## 2023-07-06 DIAGNOSIS — G4700 Insomnia, unspecified: Secondary | ICD-10-CM | POA: Diagnosis present

## 2023-07-06 DIAGNOSIS — Z885 Allergy status to narcotic agent status: Secondary | ICD-10-CM | POA: Diagnosis not present

## 2023-07-06 DIAGNOSIS — E876 Hypokalemia: Secondary | ICD-10-CM | POA: Diagnosis not present

## 2023-07-06 DIAGNOSIS — Z823 Family history of stroke: Secondary | ICD-10-CM | POA: Diagnosis not present

## 2023-07-06 DIAGNOSIS — K219 Gastro-esophageal reflux disease without esophagitis: Secondary | ICD-10-CM | POA: Diagnosis present

## 2023-07-06 DIAGNOSIS — E66812 Obesity, class 2: Secondary | ICD-10-CM | POA: Diagnosis present

## 2023-07-06 DIAGNOSIS — R5381 Other malaise: Secondary | ICD-10-CM | POA: Diagnosis present

## 2023-07-06 DIAGNOSIS — Z8249 Family history of ischemic heart disease and other diseases of the circulatory system: Secondary | ICD-10-CM | POA: Diagnosis not present

## 2023-07-06 DIAGNOSIS — K900 Celiac disease: Secondary | ICD-10-CM | POA: Diagnosis present

## 2023-07-06 DIAGNOSIS — Z825 Family history of asthma and other chronic lower respiratory diseases: Secondary | ICD-10-CM | POA: Diagnosis not present

## 2023-07-06 DIAGNOSIS — R000 Tachycardia, unspecified: Secondary | ICD-10-CM | POA: Diagnosis present

## 2023-07-06 DIAGNOSIS — K851 Biliary acute pancreatitis without necrosis or infection: Secondary | ICD-10-CM | POA: Diagnosis present

## 2023-07-06 DIAGNOSIS — J918 Pleural effusion in other conditions classified elsewhere: Secondary | ICD-10-CM | POA: Diagnosis present

## 2023-07-06 DIAGNOSIS — Z6836 Body mass index (BMI) 36.0-36.9, adult: Secondary | ICD-10-CM | POA: Diagnosis not present

## 2023-07-06 DIAGNOSIS — F32A Depression, unspecified: Secondary | ICD-10-CM | POA: Diagnosis present

## 2023-07-06 DIAGNOSIS — I3139 Other pericardial effusion (noninflammatory): Secondary | ICD-10-CM | POA: Diagnosis present

## 2023-07-06 DIAGNOSIS — J9811 Atelectasis: Secondary | ICD-10-CM | POA: Diagnosis present

## 2023-07-06 DIAGNOSIS — J189 Pneumonia, unspecified organism: Secondary | ICD-10-CM | POA: Diagnosis present

## 2023-07-06 DIAGNOSIS — Z833 Family history of diabetes mellitus: Secondary | ICD-10-CM | POA: Diagnosis not present

## 2023-07-06 LAB — CBC
HCT: 44.3 % (ref 36.0–46.0)
Hemoglobin: 13.7 g/dL (ref 12.0–15.0)
MCH: 29.2 pg (ref 26.0–34.0)
MCHC: 30.9 g/dL (ref 30.0–36.0)
MCV: 94.5 fL (ref 80.0–100.0)
Platelets: 176 10*3/uL (ref 150–400)
RBC: 4.69 MIL/uL (ref 3.87–5.11)
RDW: 14.2 % (ref 11.5–15.5)
WBC: 12 10*3/uL — ABNORMAL HIGH (ref 4.0–10.5)
nRBC: 0 % (ref 0.0–0.2)

## 2023-07-06 LAB — COMPREHENSIVE METABOLIC PANEL WITH GFR
ALT: 203 U/L — ABNORMAL HIGH (ref 0–44)
AST: 25 U/L (ref 15–41)
Albumin: 2.9 g/dL — ABNORMAL LOW (ref 3.5–5.0)
Alkaline Phosphatase: 86 U/L (ref 38–126)
Anion gap: 13 (ref 5–15)
BUN: 6 mg/dL (ref 6–20)
CO2: 27 mmol/L (ref 22–32)
Calcium: 8.6 mg/dL — ABNORMAL LOW (ref 8.9–10.3)
Chloride: 98 mmol/L (ref 98–111)
Creatinine, Ser: 0.66 mg/dL (ref 0.44–1.00)
GFR, Estimated: >60 mL/min (ref >60)
Glucose, Bld: 90 mg/dL (ref 70–99)
Potassium: 3.4 mmol/L — ABNORMAL LOW (ref 3.5–5.1)
Sodium: 138 mmol/L (ref 135–145)
Total Bilirubin: 1.4 mg/dL — ABNORMAL HIGH (ref 0.0–1.2)
Total Protein: 6.8 g/dL (ref 6.5–8.1)

## 2023-07-06 LAB — PROTIME-INR
INR: 1.2 (ref 0.8–1.2)
Prothrombin Time: 15 s (ref 11.4–15.2)

## 2023-07-06 LAB — URINE CULTURE: Culture: <10000 — AB

## 2023-07-06 LAB — STREP PNEUMONIAE URINARY ANTIGEN: Strep Pneumo Urinary Antigen: NEGATIVE

## 2023-07-06 LAB — MRSA NEXT GEN BY PCR, NASAL: MRSA by PCR Next Gen: NOT DETECTED

## 2023-07-06 MED ORDER — VANCOMYCIN HCL 1750 MG/350ML IV SOLN: 1750.0000 mg | INTRAVENOUS | Status: DC

## 2023-07-06 MED ORDER — ENOXAPARIN SODIUM 40 MG/0.4ML IJ SOSY
40.0000 mg | PREFILLED_SYRINGE | Freq: Every day | INTRAMUSCULAR | Status: DC
  Administered 2023-07-06 – 2023-07-11 (×6): 40 mg via SUBCUTANEOUS
  Filled 2023-07-06 (×6): qty 0.4

## 2023-07-06 MED ORDER — PANTOPRAZOLE SODIUM 40 MG PO TBEC
40.0000 mg | DELAYED_RELEASE_TABLET | Freq: Two times a day (BID) | ORAL | Status: DC
  Administered 2023-07-06 – 2023-07-12 (×12): 40 mg via ORAL
  Filled 2023-07-06 (×12): qty 1

## 2023-07-06 NOTE — Plan of Care (Signed)
  Problem: Clinical Measurements: Goal: Diagnostic test results will improve Outcome: Progressing   Problem: Coping: Goal: Level of anxiety will decrease Outcome: Progressing   

## 2023-07-06 NOTE — Progress Notes (Signed)
   07/06/23 1451  TOC Brief Assessment  Insurance and Status Reviewed  Patient has primary care physician Yes  Home environment has been reviewed resides in private residence  Prior level of function: Independent  Prior/Current Home Services No current home services  Social Drivers of Health Review SDOH reviewed no interventions necessary  Readmission risk has been reviewed Yes  Transition of care needs no transition of care needs at this time

## 2023-07-06 NOTE — Plan of Care (Signed)

## 2023-07-06 NOTE — Progress Notes (Signed)
 Progress Note     Subjective: Continues to have abdominal pain - recently got pain medications which have helped. Shortness of breath is improved. She has had ongoing nausea improved with nausea medications. She is thirsty  Mom and sister at bedside Objective: Vital signs in last 24 hours: Temp:  [97.9 F (36.6 C)-100.1 F (37.8 C)] 99.1 F (37.3 C) (04/03 1026) Pulse Rate:  [106-134] 109 (04/03 1026) Resp:  [15-18] 16 (04/03 1026) BP: (111-119)/(69-85) 112/72 (04/03 1026) SpO2:  [93 %-100 %] 97 % (04/03 1026) Weight:  [90 kg] 90 kg (04/02 1757) Last BM Date : 07/02/23  Intake/Output from previous day: 04/02 0701 - 04/03 0700 In: 1111.3 [I.V.:599.8; IV Piggyback:511.5] Out: 0  Intake/Output this shift: No intake/output data recorded.  PE: General: pleasant, WD, female who is laying in bed in NAD Lungs: Respiratory effort nonlabored on supplemental O2 via [place] Abd: soft, NT, moderate TTP in epigastrium MSK: all 4 extremities are symmetrical with no cyanosis, clubbing, or edema. Skin: warm and dry Psych: A&Ox3 with an appropriate affect.    Lab Results:  Recent Labs    07/05/23 1840 07/06/23 0452  WBC 16.5* 12.0*  HGB 14.9 13.7  HCT 46.7* 44.3  PLT 226 176   BMET Recent Labs    07/05/23 1840 07/06/23 0452  NA 138 138  K 3.6 3.4*  CL 98 98  CO2 28 27  GLUCOSE 106* 90  BUN 6 6  CREATININE 0.73 0.66  CALCIUM 9.0 8.6*   PT/INR Recent Labs    07/06/23 0452  LABPROT 15.0  INR 1.2   CMP     Component Value Date/Time   NA 138 07/06/2023 0452   K 3.4 (L) 07/06/2023 0452   CL 98 07/06/2023 0452   CO2 27 07/06/2023 0452   GLUCOSE 90 07/06/2023 0452   BUN 6 07/06/2023 0452   CREATININE 0.66 07/06/2023 0452   CALCIUM 8.6 (L) 07/06/2023 0452   PROT 6.8 07/06/2023 0452   ALBUMIN 2.9 (L) 07/06/2023 0452   AST 25 07/06/2023 0452   ALT 203 (H) 07/06/2023 0452   ALKPHOS 86 07/06/2023 0452   BILITOT 1.4 (H) 07/06/2023 0452   GFRNONAA >60 07/06/2023 0452    GFRAA NOT CALCULATED 03/28/2013 0210   Lipase     Component Value Date/Time   LIPASE 74 (H) 07/05/2023 1840       Studies/Results: CT Angio Chest Pulmonary Embolism (PE) W or WO Contrast Result Date: 07/05/2023 CLINICAL DATA:  PE suspected. Pancreatitis diagnosed on Sunday. Low oxygen saturations. EXAM: CT ANGIOGRAPHY CHEST WITH CONTRAST TECHNIQUE: Multidetector CT imaging of the chest was performed using the standard protocol during bolus administration of intravenous contrast. Multiplanar CT image reconstructions and MIPs were obtained to evaluate the vascular anatomy. RADIATION DOSE REDUCTION: This exam was performed according to the departmental dose-optimization program which includes automated exposure control, adjustment of the mA and/or kV according to patient size and/or use of iterative reconstruction technique. CONTRAST:  75mL OMNIPAQUE IOHEXOL 350 MG/ML SOLN COMPARISON:  Same day radiograph and CT 07/03/2023 FINDINGS: Cardiovascular: Small pericardial effusion, increased from 07/03/2023. Normal caliber thoracic aorta. Negative for acute pulmonary embolism. Mediastinum/Nodes: Trachea is unremarkable. Unremarkable esophagus. No thoracic adenopathy. Lungs/Pleura: Bilateral patchy ground-glass opacities in the upper and right middle lobes. Dense consolidation in the bilateral lower lobes and lingula with some low-density fluid. Trace right and small left pleural effusions. No pneumothorax. Upper Abdomen: Inflammatory stranding and edema about the pancreas. No ductal dilation or organized fluid collection. Musculoskeletal:  No acute fracture. Review of the MIP images confirms the above findings. IMPRESSION: 1. Negative for acute pulmonary embolism. 2. Findings suggest multifocal pneumonia. 3. Trace right and small left pleural effusions. 4. Small pericardial effusion, new since 07/03/2023. 5. Similar acute pancreatitis and duodenitis Electronically Signed   By: Minerva Fester M.D.   On:  07/05/2023 22:46   US Abdomen Limited RUQ (LIVER/GB) Result Date: 07/05/2023 CLINICAL DATA:  Pancreatitis EXAM: ULTRASOUND ABDOMEN LIMITED RIGHT UPPER QUADRANT COMPARISON:  CT 07/03/2023 FINDINGS: Gallbladder: Stones and sludge within the gallbladder. Stones measure up to 6 mm. Wall thickening measuring 4 mm. Negative sonographic Murphy sign. Common bile duct: Diameter: Normal caliber, 3 mm. Liver: No focal lesion identified. Within normal limits in parenchymal echogenicity. Portal vein is patent on color Doppler imaging with normal direction of blood flow towards the liver. Other: None. IMPRESSION: Sludge and stones within the gallbladder. Mild gallbladder wall thickening. Appearance is concerning for possible cholecystitis. Recommend clinical correlation. Electronically Signed   By: Charlett Nose M.D.   On: 07/05/2023 20:13   DG Chest 2 View Result Date: 07/05/2023 CLINICAL DATA:  Shortness of breath.  Acute pancreatitis. EXAM: CHEST - 2 VIEW COMPARISON:  07/02/2023 FINDINGS: The heart size and mediastinal contours are within normal limits. Low lung volumes are now seen with development of bibasilar atelectasis. Probable small left pleural effusion also noted. IMPRESSION: Bibasilar atelectasis, and probable small left pleural effusion. Electronically Signed   By: Danae Orleans M.D.   On: 07/05/2023 20:05    Anti-infectives: Anti-infectives (From admission, onward)    Start     Dose/Rate Route Frequency Ordered Stop   07/06/23 2200  vancomycin (VANCOREADY) IVPB 1750 mg/350 mL        1,750 mg 175 mL/hr over 120 Minutes Intravenous Every 24 hours 07/06/23 0020     07/06/23 0400  piperacillin-tazobactam (ZOSYN) IVPB 3.375 g        3.375 g 12.5 mL/hr over 240 Minutes Intravenous Every 8 hours 07/05/23 2300     07/06/23 0015  vancomycin (VANCOREADY) IVPB 2000 mg/400 mL        2,000 mg 200 mL/hr over 120 Minutes Intravenous  Once 07/05/23 2316 07/06/23 0749   07/05/23 2000  cefTRIAXone (ROCEPHIN) 2 g in  sodium chloride 0.9 % 100 mL IVPB        2 g 200 mL/hr over 30 Minutes Intravenous  Once 07/05/23 1949 07/05/23 2130        Assessment/Plan  Gallstone pancreatitis  - CT 3/31 with acute edematous pancreatitis.Thickened duodenum adjacent to the pancreas which could be duodenitis or secondary edema. - Lipase 3/31 4848 but now normal, LFTs elevated but improving. T bili 3.7 initially but now 1.4. recheck CMP am - AF, WBC improved to 12 (16.5) - ongoing abdominal pain. Recommend sips of clears today and wait improvement in pancreatitis and PNA prior to lap chole this admission. Surgery as early as tomorrow pending abdominal exam  FEN: sips of clears. NPO MN. IVF per primary ID: [email or web address], vanc VTE: okay for chemical ppx from surgical standpoint  Per primary: GERD Depression Multifocal pneumonia with acute hypoxic respiratory failure  I reviewed hospitalist notes, last 24 h vitals and pain scores, last 48 h intake and output, last 24 h labs and trends, and last 24 h imaging results.    LOS: 0 days   Eric Form, [facility] 07/06/2023, 10:31 AM Please see Amion for pager number during day hours 7:00am-4:30pm

## 2023-07-06 NOTE — Progress Notes (Signed)
 PROGRESS NOTE    Chloe Rice  ZOX:[id number] DOB: 1995/08/18 DOA: 07/05/2023 PCP: [facility] [place]  Chief Complaint  Patient presents with   Abdominal Pain   Nausea    Brief Narrative:   Chloe Rice is Chloe Rice 28 y.o. female with hx obesity, GERD, depression presenting with abdominal pain.  Was seen in the ED [facility] few days prior to admission and found to have pancreatitis.    Assessment & Plan:   Principal Problem:   Acute gallstone pancreatitis  Acute gallstone pancreatitis - denies etoh.  [place] with sludge and stones within gallbladder.   - CT on 3/31 with edematous pancreatitis as well as thickened duodenum adjacent to pancreas (duodenitis vs secondary edema) - will have low threshold to repeat CT abd/pelvis  - LFT's mildly improved today, continue to trend - continue IVF - clears from floor - pain control  - surgery c/s for cholecystectomy  Possible Cholecystitis - continue antibiotics - appreciate surgery c/s, plan for cholecystectomy  Acute Hypoxic Respiratory Failure  Multifocal Pneumonia - currently on abx as noted above - follow urine strep, urine legionella, MRSA, sputum cx if able to collect - ? If related to inflammation from pancreatitis   Bilateral Pleural Effusions - related to pancreatitis  - watch carefully with IVF for si/sx volume overload  Pericardial Effusion  GERD-continue Protonix   Depression-continue Zoloft  Obesity noted      DVT prophylaxis: SCD, lovenox Code Status: full Family Communication: none Disposition:   Status is: Observation The patient will require care spanning > 2 midnights and should be moved to inpatient because: need for inpatient surgical management   Consultants:  surgery  Procedures:  none  Antimicrobials:  Anti-infectives (From admission, onward)    Start     Dose/Rate Route Frequency Ordered Stop   07/06/23 2200  vancomycin (VANCOREADY) IVPB 1750 mg/350 mL  Status:  Discontinued         1,750 mg 175 mL/hr over 120 Minutes Intravenous Every 24 hours 07/06/23 0020 07/06/23 1202   07/06/23 0400  piperacillin-tazobactam (ZOSYN) IVPB 3.375 g        3.375 g 12.5 mL/hr over 240 Minutes Intravenous Every 8 hours 07/05/23 2300     07/06/23 0015  vancomycin (VANCOREADY) IVPB 2000 mg/400 mL        2,000 mg 200 mL/hr over 120 Minutes Intravenous  Once 07/05/23 2316 07/06/23 0749   07/05/23 2000  cefTRIAXone (ROCEPHIN) 2 g in sodium chloride 0.9 % 100 mL IVPB        2 g 200 mL/hr over 30 Minutes Intravenous  Once 07/05/23 1949 07/05/23 2130       Subjective: C/o excruciating pain   Objective: Vitals:   07/06/23 0450 07/06/23 0720 07/06/23 1026 07/06/23 1358  BP: 111/69 115/71 112/72 115/67  Pulse: (!) 129 (!) 120 (!) 109 89  Resp: 15 16 16 15   Temp: 99.7 F (37.6 C) 100.1 F (37.8 C) 99.1 F (37.3 C) 98.1 F (36.7 C)  TempSrc: Oral Oral Oral Oral  SpO2: 100% 100% 97% 98%  Weight:      Height:        Intake/Output Summary (Last 24 hours) at 07/06/2023 1400 Last data filed at 07/06/2023 1000 Gross per 24 hour  Intake 1111.33 ml  Output 0 ml  Net 1111.33 ml   Filed Weights   07/05/23 1757  Weight: 90 kg    Examination:  General exam: Appears calm and comfortable  Respiratory system: unlabored Cardiovascular system:  RRR Gastrointestinal system: mild diffuse abdominal TTP - worse in epigastric and LUQ (no rebound/guarding) Central nervous system: Alert and oriented. No focal neurological deficits. Extremities: no LEE   Data Reviewed: I have personally reviewed following labs and imaging studies  CBC: Recent Labs  Lab 07/02/23 2325 07/05/23 1840 07/06/23 0452  WBC 8.3 16.5* 12.0*  NEUTROABS  --  13.1*  --   HGB 15.4* 14.9 13.7  HCT 46.9* 46.7* 44.3  MCV 89.8 91.9 94.5  PLT 193 226 176    Basic Metabolic Panel: Recent Labs  Lab 07/02/23 2325 07/05/23 1840 07/06/23 0452  NA 140 138 138  K 3.9 3.6 3.4*  CL 106 98 98  CO2 25 28 27   GLUCOSE  125* 106* 90  BUN 13 6 6   CREATININE 0.65 0.73 0.66  CALCIUM 9.7 9.0 8.6*    GFR: Estimated Creatinine Clearance: 110.2 mL/min (by C-G formula based on SCr of 0.66 mg/dL).  Liver Function Tests: Recent Labs  Lab 07/03/23 0342 07/05/23 1840 07/06/23 0452  AST 828* 44* 25  ALT 1,032* 282* 203*  ALKPHOS 127* 103 86  BILITOT 3.7* 1.2 1.4*  PROT 6.5 7.6 6.8  ALBUMIN 4.3 3.6 2.9*    CBG: No results for input(s): "GLUCAP" in the last 168 hours.   Recent Results (from the past 240 hours)  MRSA Next Gen by PCR, Nasal     Status: None   Collection Time: 07/06/23  8:25 AM   Specimen: Nasal Mucosa; Nasal Swab  Result Value Ref Range Status   MRSA by PCR Next Gen NOT DETECTED NOT DETECTED Final    Comment: (NOTE) The GeneXpert MRSA Assay (FDA approved for NASAL specimens only), is one component of Chloe Rice comprehensive MRSA colonization surveillance program. It is not intended to diagnose MRSA infection nor to guide or monitor treatment for MRSA infections. Test performance is not FDA approved in patients less than 81 years old. Performed at [facility], [address] [place], [place] [address]          Radiology Studies: CT Angio Chest Pulmonary Embolism (PE) W or WO Contrast Result Date: 07/05/2023 CLINICAL DATA:  PE suspected. Pancreatitis diagnosed on Sunday. Low oxygen saturations. EXAM: CT ANGIOGRAPHY CHEST WITH CONTRAST TECHNIQUE: Multidetector CT imaging of the chest was performed using the standard protocol during bolus administration of intravenous contrast. Multiplanar CT image reconstructions and MIPs were obtained to evaluate the vascular anatomy. RADIATION DOSE REDUCTION: This exam was performed according to the departmental dose-optimization program which includes automated exposure control, adjustment of the mA and/or kV according to patient size and/or use of iterative reconstruction technique. CONTRAST:  75mL OMNIPAQUE IOHEXOL 350 MG/ML SOLN  COMPARISON:  Same day radiograph and CT 07/03/2023 FINDINGS: Cardiovascular: Small pericardial effusion, increased from 07/03/2023. Normal caliber thoracic aorta. Negative for acute pulmonary embolism. Mediastinum/Nodes: Trachea is unremarkable. Unremarkable esophagus. No thoracic adenopathy. Lungs/Pleura: Bilateral patchy ground-glass opacities in the upper and right middle lobes. Dense consolidation in the bilateral lower lobes and lingula with some low-density fluid. Trace right and small left pleural effusions. No pneumothorax. Upper Abdomen: Inflammatory stranding and edema about the pancreas. No ductal dilation or organized fluid collection. Musculoskeletal: No acute fracture. Review of the MIP images confirms the above findings. IMPRESSION: 1. Negative for acute pulmonary embolism. 2. Findings suggest multifocal pneumonia. 3. Trace right and small left pleural effusions. 4. Small pericardial effusion, new since 07/03/2023. 5. Similar acute pancreatitis and duodenitis Electronically Signed   By: Angelique Holm.D.  On: 07/05/2023 22:46   US Abdomen Limited RUQ (LIVER/GB) Result Date: 07/05/2023 CLINICAL DATA:  Pancreatitis EXAM: ULTRASOUND ABDOMEN LIMITED RIGHT UPPER QUADRANT COMPARISON:  CT 07/03/2023 FINDINGS: Gallbladder: Stones and sludge within the gallbladder. Stones measure up to 6 mm. Wall thickening measuring 4 mm. Negative sonographic Murphy sign. Common bile duct: Diameter: Normal caliber, 3 mm. Liver: No focal lesion identified. Within normal limits in parenchymal echogenicity. Portal vein is patent on color Doppler imaging with normal direction of blood flow towards the liver. Other: None. IMPRESSION: Sludge and stones within the gallbladder. Mild gallbladder wall thickening. Appearance is concerning for possible cholecystitis. Recommend clinical correlation. Electronically Signed   By: Charlett Nose M.D.   On: 07/05/2023 20:13   DG Chest 2 View Result Date: 07/05/2023 CLINICAL DATA:   Shortness of breath.  Acute pancreatitis. EXAM: CHEST - 2 VIEW COMPARISON:  07/02/2023 FINDINGS: The heart size and mediastinal contours are within normal limits. Low lung volumes are now seen with development of bibasilar atelectasis. Probable small left pleural effusion also noted. IMPRESSION: Bibasilar atelectasis, and probable small left pleural effusion. Electronically Signed   By: Danae Orleans M.D.   On: 07/05/2023 20:05        Scheduled Meds:  pantoprazole  40 mg Oral BID   sertraline  50 mg Oral Daily   Continuous Infusions:  lactated ringers 125 mL/hr at 07/06/23 0828   piperacillin-tazobactam 3.375 g (07/06/23 1240)     LOS: 0 days    Time spent: over 30 min    Lacretia Nicks, MD [facility]   To contact the attending provider between 7A-7P or the covering provider during after hours 7P-7A, please log into the web site [email or web address] and access using universal [place] password for that web site. If you do not have the password, please call the hospital operator.  07/06/2023, 2:00 PM

## 2023-07-07 ENCOUNTER — Inpatient Hospital Stay (HOSPITAL_COMMUNITY)

## 2023-07-07 DIAGNOSIS — I3139 Other pericardial effusion (noninflammatory): Secondary | ICD-10-CM

## 2023-07-07 DIAGNOSIS — K851 Biliary acute pancreatitis without necrosis or infection: Secondary | ICD-10-CM | POA: Diagnosis not present

## 2023-07-07 LAB — CBC WITH DIFFERENTIAL/PLATELET
Abs Immature Granulocytes: 0.03 10*3/uL (ref 0.00–0.07)
Basophils Absolute: 0 10*3/uL (ref 0.0–0.1)
Basophils Relative: 0 %
Eosinophils Absolute: 0.1 10*3/uL (ref 0.0–0.5)
Eosinophils Relative: 2 %
HCT: 37.2 % (ref 36.0–46.0)
Hemoglobin: 11.2 g/dL — ABNORMAL LOW (ref 12.0–15.0)
Immature Granulocytes: 0 %
Lymphocytes Relative: 12 %
Lymphs Abs: 1.1 10*3/uL (ref 0.7–4.0)
MCH: 29.1 pg (ref 26.0–34.0)
MCHC: 30.1 g/dL (ref 30.0–36.0)
MCV: 96.6 fL (ref 80.0–100.0)
Monocytes Absolute: 0.6 10*3/uL (ref 0.1–1.0)
Monocytes Relative: 7 %
Neutro Abs: 7.1 10*3/uL (ref 1.7–7.7)
Neutrophils Relative %: 79 %
Platelets: 134 10*3/uL — ABNORMAL LOW (ref 150–400)
RBC: 3.85 MIL/uL — ABNORMAL LOW (ref 3.87–5.11)
RDW: 14.4 % (ref 11.5–15.5)
WBC: 9 10*3/uL (ref 4.0–10.5)
nRBC: 0 % (ref 0.0–0.2)

## 2023-07-07 LAB — COMPREHENSIVE METABOLIC PANEL WITH GFR
ALT: 107 U/L — ABNORMAL HIGH (ref 0–44)
AST: 13 U/L — ABNORMAL LOW (ref 15–41)
Albumin: 2.2 g/dL — ABNORMAL LOW (ref 3.5–5.0)
Alkaline Phosphatase: 62 U/L (ref 38–126)
Anion gap: 11 (ref 5–15)
BUN: <5 mg/dL — ABNORMAL LOW (ref 6–20)
CO2: 26 mmol/L (ref 22–32)
Calcium: 8 mg/dL — ABNORMAL LOW (ref 8.9–10.3)
Chloride: 100 mmol/L (ref 98–111)
Creatinine, Ser: 0.38 mg/dL — ABNORMAL LOW (ref 0.44–1.00)
GFR, Estimated: >60 mL/min (ref >60)
Glucose, Bld: 79 mg/dL (ref 70–99)
Potassium: 3.3 mmol/L — ABNORMAL LOW (ref 3.5–5.1)
Sodium: 137 mmol/L (ref 135–145)
Total Bilirubin: 1 mg/dL (ref 0.0–1.2)
Total Protein: 5.2 g/dL — ABNORMAL LOW (ref 6.5–8.1)

## 2023-07-07 LAB — ECHOCARDIOGRAM COMPLETE
AR max vel: 2.34 cm2
AV Area VTI: 2.12 cm2
AV Area mean vel: 2.22 cm2
AV Mean grad: 6 mmHg
AV Peak grad: 10.2 mmHg
Ao pk vel: 1.6 m/s
Area-P 1/2: 5.02 cm2
Height: 62 in
S' Lateral: 2.7 cm
Weight: 3174.62 [oz_av]

## 2023-07-07 LAB — PHOSPHORUS: Phosphorus: 1.6 mg/dL — ABNORMAL LOW (ref 2.5–4.6)

## 2023-07-07 LAB — MAGNESIUM: Magnesium: 1.7 mg/dL (ref 1.7–2.4)

## 2023-07-07 MED ORDER — POTASSIUM CHLORIDE CRYS ER 20 MEQ PO TBCR
40.0000 meq | EXTENDED_RELEASE_TABLET | Freq: Once | ORAL | Status: AC
  Administered 2023-07-07: 40 meq via ORAL
  Filled 2023-07-07: qty 2

## 2023-07-07 MED ORDER — K PHOS MONO-SOD PHOS DI & MONO 155-852-130 MG PO TABS
500.0000 mg | ORAL_TABLET | Freq: Four times a day (QID) | ORAL | Status: AC
  Administered 2023-07-07 – 2023-07-08 (×8): 500 mg via ORAL
  Filled 2023-07-07 (×8): qty 2

## 2023-07-07 NOTE — Progress Notes (Addendum)
 Progress Note     Subjective: Pain and nausea overall improved except for one episode of pain this am. She woke up early with a headache for which she received pain meds and the went back to sleep only to wake up shortly later with 7/10 abdominal and back pain which improved after pain meds. Pain remains improved this am. She is passing flatus. No BM since PTA. Tried to wean O2 yesterday and O2 sats dropped and on 2.5 lpm this am  Husband at bedside Objective: Vital signs in last 24 hours: Temp:  [98.1 F (36.7 C)-100 F (37.8 C)] 99.1 F (37.3 C) (04/04 0629) Pulse Rate:  [89-110] 107 (04/04 0629) Resp:  [15-18] 18 (04/04 0629) BP: (107-116)/(64-76) 111/66 (04/04 0629) SpO2:  [97 %-100 %] 99 % (04/04 0629) Last BM Date : 07/02/23  Intake/Output from previous day: 04/03 0701 - 04/04 0700 In: 3811.7 [P.O.:720; I.V.:2927.3; IV Piggyback:164.4] Out: 1400 [Urine:1400] Intake/Output this shift: No intake/output data recorded.  PE: General: pleasant, WD, female who is laying in bed in NAD Lungs: Respiratory effort nonlabored on supplemental O2 via [place] - 2.5 lpm Abd: soft, NT, mild TTP in epigastrium MSK: all 4 extremities are symmetrical with no cyanosis, clubbing, or edema. Skin: warm and dry Psych: A&Ox3 with an appropriate affect.    Lab Results:  Recent Labs    07/06/23 0452 07/07/23 0442  WBC 12.0* 9.0  HGB 13.7 11.2*  HCT 44.3 37.2  PLT 176 134*   BMET Recent Labs    07/06/23 0452 07/07/23 0442  NA 138 137  K 3.4* 3.3*  CL 98 100  CO2 27 26  GLUCOSE 90 79  BUN 6 <5*  CREATININE 0.66 0.38*  CALCIUM 8.6* 8.0*   PT/INR Recent Labs    07/06/23 0452  LABPROT 15.0  INR 1.2   CMP     Component Value Date/Time   NA 137 07/07/2023 0442   K 3.3 (L) 07/07/2023 0442   CL 100 07/07/2023 0442   CO2 26 07/07/2023 0442   GLUCOSE 79 07/07/2023 0442   BUN <5 (L) 07/07/2023 0442   CREATININE 0.38 (L) 07/07/2023 0442   CALCIUM 8.0 (L) 07/07/2023 0442   PROT  5.2 (L) 07/07/2023 0442   ALBUMIN 2.2 (L) 07/07/2023 0442   AST 13 (L) 07/07/2023 0442   ALT 107 (H) 07/07/2023 0442   ALKPHOS 62 07/07/2023 0442   BILITOT 1.0 07/07/2023 0442   GFRNONAA >60 07/07/2023 0442   GFRAA NOT CALCULATED 03/28/2013 0210   Lipase     Component Value Date/Time   LIPASE 74 (H) 07/05/2023 1840       Studies/Results: CT Angio Chest Pulmonary Embolism (PE) W or WO Contrast Result Date: 07/05/2023 CLINICAL DATA:  PE suspected. Pancreatitis diagnosed on Sunday. Low oxygen saturations. EXAM: CT ANGIOGRAPHY CHEST WITH CONTRAST TECHNIQUE: Multidetector CT imaging of the chest was performed using the standard protocol during bolus administration of intravenous contrast. Multiplanar CT image reconstructions and MIPs were obtained to evaluate the vascular anatomy. RADIATION DOSE REDUCTION: This exam was performed according to the departmental dose-optimization program which includes automated exposure control, adjustment of the mA and/or kV according to patient size and/or use of iterative reconstruction technique. CONTRAST:  75mL OMNIPAQUE IOHEXOL 350 MG/ML SOLN COMPARISON:  Same day radiograph and CT 07/03/2023 FINDINGS: Cardiovascular: Small pericardial effusion, increased from 07/03/2023. Normal caliber thoracic aorta. Negative for acute pulmonary embolism. Mediastinum/Nodes: Trachea is unremarkable. Unremarkable esophagus. No thoracic adenopathy. Lungs/Pleura: Bilateral patchy ground-glass opacities in the  upper and right middle lobes. Dense consolidation in the bilateral lower lobes and lingula with some low-density fluid. Trace right and small left pleural effusions. No pneumothorax. Upper Abdomen: Inflammatory stranding and edema about the pancreas. No ductal dilation or organized fluid collection. Musculoskeletal: No acute fracture. Review of the MIP images confirms the above findings. IMPRESSION: 1. Negative for acute pulmonary embolism. 2. Findings suggest multifocal  pneumonia. 3. Trace right and small left pleural effusions. 4. Small pericardial effusion, new since 07/03/2023. 5. Similar acute pancreatitis and duodenitis Electronically Signed   By: Minerva Fester M.D.   On: 07/05/2023 22:46   US Abdomen Limited RUQ (LIVER/GB) Result Date: 07/05/2023 CLINICAL DATA:  Pancreatitis EXAM: ULTRASOUND ABDOMEN LIMITED RIGHT UPPER QUADRANT COMPARISON:  CT 07/03/2023 FINDINGS: Gallbladder: Stones and sludge within the gallbladder. Stones measure up to 6 mm. Wall thickening measuring 4 mm. Negative sonographic Murphy sign. Common bile duct: Diameter: Normal caliber, 3 mm. Liver: No focal lesion identified. Within normal limits in parenchymal echogenicity. Portal vein is patent on color Doppler imaging with normal direction of blood flow towards the liver. Other: None. IMPRESSION: Sludge and stones within the gallbladder. Mild gallbladder wall thickening. Appearance is concerning for possible cholecystitis. Recommend clinical correlation. Electronically Signed   By: Charlett Nose M.D.   On: 07/05/2023 20:13   DG Chest 2 View Result Date: 07/05/2023 CLINICAL DATA:  Shortness of breath.  Acute pancreatitis. EXAM: CHEST - 2 VIEW COMPARISON:  07/02/2023 FINDINGS: The heart size and mediastinal contours are within normal limits. Low lung volumes are now seen with development of bibasilar atelectasis. Probable small left pleural effusion also noted. IMPRESSION: Bibasilar atelectasis, and probable small left pleural effusion. Electronically Signed   By: Danae Orleans M.D.   On: 07/05/2023 20:05    Anti-infectives: Anti-infectives (From admission, onward)    Start     Dose/Rate Route Frequency Ordered Stop   07/06/23 2200  vancomycin (VANCOREADY) IVPB 1750 mg/350 mL  Status:  Discontinued        1,750 mg 175 mL/hr over 120 Minutes Intravenous Every 24 hours 07/06/23 0020 07/06/23 1202   07/06/23 0400  piperacillin-tazobactam (ZOSYN) IVPB 3.375 g        3.375 g 12.5 mL/hr over 240  Minutes Intravenous Every 8 hours 07/05/23 2300     07/06/23 0015  vancomycin (VANCOREADY) IVPB 2000 mg/400 mL        2,000 mg 200 mL/hr over 120 Minutes Intravenous  Once 07/05/23 2316 07/06/23 0749   07/05/23 2000  cefTRIAXone (ROCEPHIN) 2 g in sodium chloride 0.9 % 100 mL IVPB        2 g 200 mL/hr over 30 Minutes Intravenous  Once 07/05/23 1949 07/05/23 2130        Assessment/Plan  Gallstone pancreatitis  - CT 3/31 with acute edematous pancreatitis.Thickened duodenum adjacent to the pancreas which could be duodenitis or secondary edema. - Lipase 3/31 4848 but normalized, LFTs improving. T bili 3.7 initially - now normal. - AF, WBC normalized - ongoing abdominal pain - overall improved but significant pain episode this am. Continue sips of clears today. Await improvement in pancreatitis and PNA prior to lap chole this admission.  FEN: sips of clears. IVF per primary, hypokalemia - PO repletion per [facility] ID: zosyn, vanc VTE: lovenox  Per primary: GERD Depression Multifocal pneumonia with acute hypoxic respiratory failure - still on supp O2 this am.  Bilateral Pleural Effusions Pericardial Effusion - Echo pending  I reviewed hospitalist notes, last 24 h vitals and  pain scores, last 48 h intake and output, last 24 h labs and trends, and last 24 h imaging results.    LOS: 1 day   Eric Form, [facility] 07/07/2023, 8:57 AM Please see Amion for pager number during day hours 7:00am-4:30pm

## 2023-07-07 NOTE — Progress Notes (Signed)
 PROGRESS NOTE    Chloe Rice  RUE:[id number] DOB: 09/03/1995 DOA: 07/05/2023 PCP: [facility] [place]  Chief Complaint  Patient presents with   Abdominal Pain   Nausea    Brief Narrative:   Chloe Rice is Chloe Rice 28 y.o. female with hx obesity, GERD, depression presenting with abdominal pain.  Was seen in the ED [facility] few days prior to admission and found to have pancreatitis.    Assessment & Plan:   Principal Problem:   Acute gallstone pancreatitis Active Problems:   Gallstone pancreatitis  Acute gallstone pancreatitis - denies etoh.  [place] with sludge and stones within gallbladder.   - CT on 3/31 with edematous pancreatitis as well as thickened duodenum adjacent to pancreas (duodenitis vs secondary edema) - will have low threshold to repeat CT abd/pelvis  - LFT's improved today, continue to trend - continue IVF - clears from floor - pain control  - surgery c/s for cholecystectomy - they're continuing to follow   Possible Cholecystitis - continue antibiotics - appreciate surgery c/s, plan for cholecystectomy  Acute Hypoxic Respiratory Failure  Multifocal Pneumonia - currently on abx as noted above - follow urine strep, urine legionella, MRSA, sputum cx if able to collect - ? If related to inflammation from pancreatitis   Bilateral Pleural Effusions - related to pancreatitis  - watch carefully with IVF for si/sx volume overload  Pericardial Effusion Trivial on echo   GERD-continue Protonix   Depression-continue Zoloft  Obesity noted      DVT prophylaxis: SCD, lovenox Code Status: full Family Communication: none Disposition:   Status is: Observation The patient will require care spanning > 2 midnights and should be moved to inpatient because: need for inpatient surgical management   Consultants:  surgery  Procedures:  Echo IMPRESSIONS     1. Left ventricular ejection fraction, by estimation, is 60 to 65%. The  left ventricle has normal  function. The left ventricle has no regional  wall motion abnormalities. Left ventricular diastolic parameters were  normal.   2. Right ventricular systolic function is normal. The right ventricular  size is normal. Tricuspid regurgitation signal is inadequate for assessing  PA pressure.   3. The mitral valve is normal in structure. No evidence of mitral valve  regurgitation. No evidence of mitral stenosis.   4. The aortic valve is tricuspid. Aortic valve regurgitation is not  visualized. No aortic stenosis is present.   5. The inferior vena cava is normal in size with greater than 50%  respiratory variability, suggesting right atrial pressure of 3 mmHg.   6. There is Diezel Mazur left pleural effusion. Trivial pericardial effusion.   Antimicrobials:  Anti-infectives (From admission, onward)    Start     Dose/Rate Route Frequency Ordered Stop   07/06/23 2200  vancomycin (VANCOREADY) IVPB 1750 mg/350 mL  Status:  Discontinued        1,750 mg 175 mL/hr over 120 Minutes Intravenous Every 24 hours 07/06/23 0020 07/06/23 1202   07/06/23 0400  piperacillin-tazobactam (ZOSYN) IVPB 3.375 g        3.375 g 12.5 mL/hr over 240 Minutes Intravenous Every 8 hours 07/05/23 2300     07/06/23 0015  vancomycin (VANCOREADY) IVPB 2000 mg/400 mL        2,000 mg 200 mL/hr over 120 Minutes Intravenous  Once 07/05/23 2316 07/06/23 0749   07/05/23 2000  cefTRIAXone (ROCEPHIN) 2 g in sodium chloride 0.9 % 100 mL IVPB        2 g  200 mL/hr over 30 Minutes Intravenous  Once 07/05/23 1949 07/05/23 2130       Subjective: Husband at bedside Continued pain - not particularly worse after drinking  Objective: Vitals:   07/06/23 2251 07/07/23 0205 07/07/23 0629 07/07/23 1346  BP: 114/76 116/69 111/66 102/64  Pulse: 99 (!) 110 (!) 107 93  Resp: 18 18 18    Temp: 98.6 F (37 C) 100 F (37.8 C) 99.1 F (37.3 C) 98.3 F (36.8 C)  TempSrc: Oral Oral Oral Oral  SpO2: 99% 99% 99% 92%  Weight:      Height:         Intake/Output Summary (Last 24 hours) at 07/07/2023 1754 Last data filed at 07/07/2023 1400 Gross per 24 hour  Intake 4051.69 ml  Output 1100 ml  Net 2951.69 ml   Filed Weights   07/05/23 1757  Weight: 90 kg    Examination:  General: No acute distress. Cardiovascular: RRR Lungs: unlabored, diminished Abdomen: Soft, most TTP in epigastric and LUQ Neurological: Alert and oriented 3. Moves all extremities 4 with equal strength. Cranial nerves II through XII grossly intact. Extremities: No clubbing or cyanosis. No edema.  Data Reviewed: I have personally reviewed following labs and imaging studies  CBC: Recent Labs  Lab 07/02/23 2325 07/05/23 1840 07/06/23 0452 07/07/23 0442  WBC 8.3 16.5* 12.0* 9.0  NEUTROABS  --  13.1*  --  7.1  HGB 15.4* 14.9 13.7 11.2*  HCT 46.9* 46.7* 44.3 37.2  MCV 89.8 91.9 94.5 96.6  PLT 193 226 176 134*    Basic Metabolic Panel: Recent Labs  Lab 07/02/23 2325 07/05/23 1840 07/06/23 0452 07/07/23 0442  NA 140 138 138 137  K 3.9 3.6 3.4* 3.3*  CL 106 98 98 100  CO2 25 28 27 26   GLUCOSE 125* 106* 90 79  BUN 13 6 6  <5*  CREATININE 0.65 0.73 0.66 0.38*  CALCIUM 9.7 9.0 8.6* 8.0*  MG  --   --   --  1.7  PHOS  --   --   --  1.6*    GFR: Estimated Creatinine Clearance: 110.2 mL/min (Tylan Briguglio) (by C-G formula based on SCr of 0.38 mg/dL (L)).  Liver Function Tests: Recent Labs  Lab 07/03/23 0342 07/05/23 1840 07/06/23 0452 07/07/23 0442  AST 828* 44* 25 13*  ALT 1,032* 282* 203* 107*  ALKPHOS 127* 103 86 62  BILITOT 3.7* 1.2 1.4* 1.0  PROT 6.5 7.6 6.8 5.2*  ALBUMIN 4.3 3.6 2.9* 2.2*    CBG: No results for input(s): "GLUCAP" in the last 168 hours.   Recent Results (from the past 240 hours)  Urine Culture     Status: Abnormal   Collection Time: 07/05/23  6:14 PM   Specimen: Urine, Clean Catch  Result Value Ref Range Status   Specimen Description   Final    URINE, CLEAN CATCH Performed at [facility], [address] [place], [place] [address]    Special Requests   Final    NONE Performed at [facility], [address], [place], [place] [address]    Culture (Lorrie Gargan)  Final    <10,000 COLONIES/mL INSIGNIFICANT GROWTH Performed at [facility], [address] [place], [place] [address]    Report Status 07/06/2023 FINAL  Final  MRSA Next Gen by PCR, Nasal     Status: None   Collection Time: 07/06/23  8:25 AM   Specimen: Nasal Mucosa; Nasal Swab  Result Value Ref Range  Status   MRSA by PCR Next Gen NOT DETECTED NOT DETECTED Final    Comment: (NOTE) The GeneXpert MRSA Assay (FDA approved for NASAL specimens only), is one component of Alleen Kehm comprehensive MRSA colonization surveillance program. It is not intended to diagnose MRSA infection nor to guide or monitor treatment for MRSA infections. Test performance is not FDA approved in patients less than 28 years old. Performed at [facility], [address], [place], [place] [address]          Radiology Studies: ECHOCARDIOGRAM COMPLETE Result Date: 07/07/2023    ECHOCARDIOGRAM REPORT   Patient Name:   MADORA BARLETTA Date of Exam: 07/07/2023 Medical Rec #:  [id number]  Height:       62.0 in Accession #:    [id number] Weight:       198.4 lb Date of Birth:  [birth year]  BSA:          1.905 m Patient Age:    27 years   BP:           111/66 mmHg Patient Gender: F          HR:           99 bpm. Exam Location:  Inpatient Procedure: 2D Echo, Cardiac Doppler and Color Doppler (Both Spectral and Color            Flow Doppler were utilized during procedure). Indications:    Pericardial Effusion  History:        Patient has no prior history of Echocardiogram examinations.  Sonographer:    Karma Ganja Referring Phys: [phone] Ellana Kawa CALDWELL POWELL JR IMPRESSIONS  1. Left ventricular ejection fraction, by estimation, is 60 to 65%. The left ventricle has normal function. The left ventricle has no regional wall motion abnormalities.  Left ventricular diastolic parameters were normal.  2. Right ventricular systolic function is normal. The right ventricular size is normal. Tricuspid regurgitation signal is inadequate for assessing PA pressure.  3. The mitral valve is normal in structure. No evidence of mitral valve regurgitation. No evidence of mitral stenosis.  4. The aortic valve is tricuspid. Aortic valve regurgitation is not visualized. No aortic stenosis is present.  5. The inferior vena cava is normal in size with greater than 50% respiratory variability, suggesting right atrial pressure of 3 mmHg.  6. There is Bindi Klomp left pleural effusion. Trivial pericardial effusion. FINDINGS  Left Ventricle: Left ventricular ejection fraction, by estimation, is 60 to 65%. The left ventricle has normal function. The left ventricle has no regional wall motion abnormalities. The left ventricular internal cavity size was normal in size. There is  no left ventricular hypertrophy. Left ventricular diastolic parameters were normal. Right Ventricle: The right ventricular size is normal. No increase in right ventricular wall thickness. Right ventricular systolic function is normal. Tricuspid regurgitation signal is inadequate for assessing PA pressure. Left Atrium: Left atrial size was normal in size. Right Atrium: Right atrial size was normal in size. Pericardium: There is Delron Comer left pleural effusion. Trivial pericardial effusion. Trivial pericardial effusion is present. Mitral Valve: The mitral valve is normal in structure. No evidence of mitral valve regurgitation. No evidence of mitral valve stenosis. Tricuspid Valve: The tricuspid valve is normal in structure. Tricuspid valve regurgitation is not demonstrated. Aortic Valve: The aortic valve is tricuspid. Aortic valve regurgitation is not visualized. No aortic stenosis is present. Aortic valve mean gradient measures 6.0 mmHg. Aortic valve peak gradient measures 10.2 mmHg. Aortic valve area, by VTI measures 2.12  cm.  Pulmonic Valve: The pulmonic valve was normal in structure. Pulmonic valve regurgitation is not visualized. Aorta: The aortic root is normal in size and structure. Venous: The inferior vena cava is normal in size with greater than 50% respiratory variability, suggesting right atrial pressure of 3 mmHg. IAS/Shunts: No atrial level shunt detected by color flow Doppler.  LEFT VENTRICLE PLAX 2D LVIDd:         4.20 cm   Diastology LVIDs:         2.70 cm   LV e' medial:    9.25 cm/s LV PW:         0.90 cm   LV E/e' medial:  12.0 LV IVS:        1.20 cm   LV e' lateral:   14.30 cm/s LVOT diam:     2.10 cm   LV E/e' lateral: 7.8 LV SV:         60 LV SV Index:   32 LVOT Area:     3.46 cm  RIGHT VENTRICLE             IVC RV Basal diam:  3.90 cm     IVC diam: 1.60 cm RV S prime:     14.40 cm/s TAPSE (M-mode): 2.2 cm LEFT ATRIUM             Index        RIGHT ATRIUM           Index LA diam:        3.90 cm 2.05 cm/m   RA Area:     15.70 cm LA Vol (A2C):   51.5 ml 27.03 ml/m  RA Volume:   41.20 ml  21.62 ml/m LA Vol (A4C):   32.8 ml 17.22 ml/m LA Biplane Vol: 41.5 ml 21.78 ml/m  AORTIC VALVE AV Area (Vmax):    2.34 cm AV Area (Vmean):   2.22 cm AV Area (VTI):     2.12 cm AV Vmax:           160.00 cm/s AV Vmean:          111.000 cm/s AV VTI:            0.284 m AV Peak Grad:      10.2 mmHg AV Mean Grad:      6.0 mmHg LVOT Vmax:         108.00 cm/s LVOT Vmean:        71.100 cm/s LVOT VTI:          0.174 m LVOT/AV VTI ratio: 0.61  AORTA Ao Root diam: 2.60 cm MITRAL VALVE MV Area (PHT): 5.02 cm     SHUNTS MV Decel Time: 151 msec     Systemic VTI:  0.17 m MV E velocity: 111.00 cm/s  Systemic Diam: 2.10 cm MV Zakyra Kukuk velocity: 51.40 cm/s MV E/Afia Messenger ratio:  2.16 Dalton McleanMD Electronically signed by Wilfred Lacy Signature Date/Time: 07/07/2023/9:54:26 AM    Final    CT Angio Chest Pulmonary Embolism (PE) W or WO Contrast Result Date: 07/05/2023 CLINICAL DATA:  PE suspected. Pancreatitis diagnosed on Sunday. Low oxygen saturations.  EXAM: CT ANGIOGRAPHY CHEST WITH CONTRAST TECHNIQUE: Multidetector CT imaging of the chest was performed using the standard protocol during bolus administration of intravenous contrast. Multiplanar CT image reconstructions and MIPs were obtained to evaluate the vascular anatomy. RADIATION DOSE REDUCTION: This exam was performed according to the departmental dose-optimization program which includes automated exposure control, adjustment of the mA and/or  kV according to patient size and/or use of iterative reconstruction technique. CONTRAST:  75mL OMNIPAQUE IOHEXOL 350 MG/ML SOLN COMPARISON:  Same day radiograph and CT 07/03/2023 FINDINGS: Cardiovascular: Small pericardial effusion, increased from 07/03/2023. Normal caliber thoracic aorta. Negative for acute pulmonary embolism. Mediastinum/Nodes: Trachea is unremarkable. Unremarkable esophagus. No thoracic adenopathy. Lungs/Pleura: Bilateral patchy ground-glass opacities in the upper and right middle lobes. Dense consolidation in the bilateral lower lobes and lingula with some low-density fluid. Trace right and small left pleural effusions. No pneumothorax. Upper Abdomen: Inflammatory stranding and edema about the pancreas. No ductal dilation or organized fluid collection. Musculoskeletal: No acute fracture. Review of the MIP images confirms the above findings. IMPRESSION: 1. Negative for acute pulmonary embolism. 2. Findings suggest multifocal pneumonia. 3. Trace right and small left pleural effusions. 4. Small pericardial effusion, new since 07/03/2023. 5. Similar acute pancreatitis and duodenitis Electronically Signed   By: Minerva Fester M.D.   On: 07/05/2023 22:46   US Abdomen Limited RUQ (LIVER/GB) Result Date: 07/05/2023 CLINICAL DATA:  Pancreatitis EXAM: ULTRASOUND ABDOMEN LIMITED RIGHT UPPER QUADRANT COMPARISON:  CT 07/03/2023 FINDINGS: Gallbladder: Stones and sludge within the gallbladder. Stones measure up to 6 mm. Wall thickening measuring 4 mm. Negative  sonographic Murphy sign. Common bile duct: Diameter: Normal caliber, 3 mm. Liver: No focal lesion identified. Within normal limits in parenchymal echogenicity. Portal vein is patent on color Doppler imaging with normal direction of blood flow towards the liver. Other: None. IMPRESSION: Sludge and stones within the gallbladder. Mild gallbladder wall thickening. Appearance is concerning for possible cholecystitis. Recommend clinical correlation. Electronically Signed   By: Charlett Nose M.D.   On: 07/05/2023 20:13   DG Chest 2 View Result Date: 07/05/2023 CLINICAL DATA:  Shortness of breath.  Acute pancreatitis. EXAM: CHEST - 2 VIEW COMPARISON:  07/02/2023 FINDINGS: The heart size and mediastinal contours are within normal limits. Low lung volumes are now seen with development of bibasilar atelectasis. Probable small left pleural effusion also noted. IMPRESSION: Bibasilar atelectasis, and probable small left pleural effusion. Electronically Signed   By: Danae Orleans M.D.   On: 07/05/2023 20:05        Scheduled Meds:  enoxaparin (LOVENOX) injection  40 mg Subcutaneous QHS   pantoprazole  40 mg Oral BID   phosphorus  500 mg Oral QID   sertraline  50 mg Oral Daily   Continuous Infusions:  lactated ringers 125 mL/hr at 07/07/23 1522   piperacillin-tazobactam 3.375 g (07/07/23 1236)     LOS: 1 day    Time spent: over 30 min    Lacretia Nicks, MD [facility]   To contact the attending provider between 7A-7P or the covering provider during after hours 7P-7A, please log into the web site [email or web address] and access using universal [place] password for that web site. If you do not have the password, please call the hospital operator.  07/07/2023, 5:54 PM

## 2023-07-07 NOTE — Plan of Care (Signed)

## 2023-07-07 NOTE — Progress Notes (Signed)
 Echocardiogram 2D Echocardiogram has been performed.  Chloe Rice 07/07/2023, 9:42 AM

## 2023-07-08 DIAGNOSIS — K851 Biliary acute pancreatitis without necrosis or infection: Secondary | ICD-10-CM | POA: Diagnosis not present

## 2023-07-08 LAB — CBC WITH DIFFERENTIAL/PLATELET
Abs Immature Granulocytes: 0.06 10*3/uL (ref 0.00–0.07)
Basophils Absolute: 0 10*3/uL (ref 0.0–0.1)
Basophils Relative: 0 %
Eosinophils Absolute: 0.1 10*3/uL (ref 0.0–0.5)
Eosinophils Relative: 1 %
HCT: 36 % (ref 36.0–46.0)
Hemoglobin: 10.7 g/dL — ABNORMAL LOW (ref 12.0–15.0)
Immature Granulocytes: 1 %
Lymphocytes Relative: 14 %
Lymphs Abs: 1.3 10*3/uL (ref 0.7–4.0)
MCH: 28.9 pg (ref 26.0–34.0)
MCHC: 29.7 g/dL — ABNORMAL LOW (ref 30.0–36.0)
MCV: 97.3 fL (ref 80.0–100.0)
Monocytes Absolute: 0.7 10*3/uL (ref 0.1–1.0)
Monocytes Relative: 7 %
Neutro Abs: 7.7 10*3/uL (ref 1.7–7.7)
Neutrophils Relative %: 77 %
Platelets: 163 10*3/uL (ref 150–400)
RBC: 3.7 MIL/uL — ABNORMAL LOW (ref 3.87–5.11)
RDW: 14.6 % (ref 11.5–15.5)
WBC: 9.9 10*3/uL (ref 4.0–10.5)
nRBC: 0 % (ref 0.0–0.2)

## 2023-07-08 LAB — COMPREHENSIVE METABOLIC PANEL WITH GFR
ALT: 77 U/L — ABNORMAL HIGH (ref 0–44)
AST: 12 U/L — ABNORMAL LOW (ref 15–41)
Albumin: 2.2 g/dL — ABNORMAL LOW (ref 3.5–5.0)
Alkaline Phosphatase: 67 U/L (ref 38–126)
Anion gap: 9 (ref 5–15)
BUN: <5 mg/dL — ABNORMAL LOW (ref 6–20)
CO2: 27 mmol/L (ref 22–32)
Calcium: 8.2 mg/dL — ABNORMAL LOW (ref 8.9–10.3)
Chloride: 104 mmol/L (ref 98–111)
Creatinine, Ser: 0.42 mg/dL — ABNORMAL LOW (ref 0.44–1.00)
GFR, Estimated: >60 mL/min (ref >60)
Glucose, Bld: 77 mg/dL (ref 70–99)
Potassium: 3.4 mmol/L — ABNORMAL LOW (ref 3.5–5.1)
Sodium: 140 mmol/L (ref 135–145)
Total Bilirubin: 1 mg/dL (ref 0.0–1.2)
Total Protein: 5.4 g/dL — ABNORMAL LOW (ref 6.5–8.1)

## 2023-07-08 LAB — LEGIONELLA PNEUMOPHILA SEROGP 1 UR AG: L. pneumophila Serogp 1 Ur Ag: NEGATIVE

## 2023-07-08 LAB — MAGNESIUM: Magnesium: 1.7 mg/dL (ref 1.7–2.4)

## 2023-07-08 LAB — PHOSPHORUS: Phosphorus: 2.2 mg/dL — ABNORMAL LOW (ref 2.5–4.6)

## 2023-07-08 MED ORDER — DOXEPIN HCL 10 MG PO CAPS
10.0000 mg | ORAL_CAPSULE | Freq: Every day | ORAL | Status: DC
  Administered 2023-07-08 – 2023-07-11 (×4): 10 mg via ORAL
  Filled 2023-07-08 (×4): qty 1

## 2023-07-08 MED ORDER — POTASSIUM CHLORIDE CRYS ER 20 MEQ PO TBCR
40.0000 meq | EXTENDED_RELEASE_TABLET | Freq: Once | ORAL | Status: AC
  Administered 2023-07-08: 40 meq via ORAL
  Filled 2023-07-08: qty 2

## 2023-07-08 NOTE — Progress Notes (Signed)
 Subjective/Chief Complaint: Patient remains on O2 and still getting SOB with activity Tolerating clears Abdominal pain improving   Objective: Vital signs in last 24 hours: Temp:  [98.2 F (36.8 C)-98.8 F (37.1 C)] 98.8 F (37.1 C) (04/05 0517) Pulse Rate:  [92-109] 109 (04/05 0517) Resp:  [16-18] 16 (04/05 0517) BP: (102-114)/(64-70) 114/70 (04/05 0517) SpO2:  [92 %-100 %] 100 % (04/05 0517) Weight:  [96.3 kg] 96.3 kg (04/05 0500) Last BM Date : 07/08/23  Intake/Output from previous day: 04/04 0701 - 04/05 0700 In: 2931.5 [P.O.:840; I.V.:1958.1; IV Piggyback:133.4] Out: 1000 [Urine:1000] Intake/Output this shift: No intake/output data recorded.  Exam: Awake and alert Looks comfortable No resp distress Abdomen soft, not distended, mild guarding in the upper abdomen  Lab Results:  Recent Labs    07/07/23 0442 07/08/23 0446  WBC 9.0 9.9  HGB 11.2* 10.7*  HCT 37.2 36.0  PLT 134* 163   BMET Recent Labs    07/07/23 0442 07/08/23 0446  NA 137 140  K 3.3* 3.4*  CL 100 104  CO2 26 27  GLUCOSE 79 77  BUN <5* <5*  CREATININE 0.38* 0.42*  CALCIUM 8.0* 8.2*   PT/INR Recent Labs    07/06/23 0452  LABPROT 15.0  INR 1.2   ABG Recent Labs    07/05/23 2100  PHART 7.45  HCO3 34.8*    Studies/Results: ECHOCARDIOGRAM COMPLETE Result Date: 07/07/2023    ECHOCARDIOGRAM REPORT   Patient Name:   Chloe Rice Date of Exam: 07/07/2023 Medical Rec #:  [id number]  Height:       62.0 in Accession #:    [id number] Weight:       198.4 lb Date of Birth:  [birth year]  BSA:          1.905 m Patient Age:    27 years   BP:           111/66 mmHg Patient Gender: F          HR:           99 bpm. Exam Location:  Inpatient Procedure: 2D Echo, Cardiac Doppler and Color Doppler (Both Spectral and Color            Flow Doppler were utilized during procedure). Indications:    Pericardial Effusion  History:        Patient has no prior history of Echocardiogram examinations.  Sonographer:     Karma Ganja Referring Phys: [phone] A CALDWELL POWELL JR IMPRESSIONS  1. Left ventricular ejection fraction, by estimation, is 60 to 65%. The left ventricle has normal function. The left ventricle has no regional wall motion abnormalities. Left ventricular diastolic parameters were normal.  2. Right ventricular systolic function is normal. The right ventricular size is normal. Tricuspid regurgitation signal is inadequate for assessing PA pressure.  3. The mitral valve is normal in structure. No evidence of mitral valve regurgitation. No evidence of mitral stenosis.  4. The aortic valve is tricuspid. Aortic valve regurgitation is not visualized. No aortic stenosis is present.  5. The inferior vena cava is normal in size with greater than 50% respiratory variability, suggesting right atrial pressure of 3 mmHg.  6. There is a left pleural effusion. Trivial pericardial effusion. FINDINGS  Left Ventricle: Left ventricular ejection fraction, by estimation, is 60 to 65%. The left ventricle has normal function. The left ventricle has no regional wall motion abnormalities. The left ventricular internal cavity size was normal in size. There is  no left ventricular hypertrophy.  Left ventricular diastolic parameters were normal. Right Ventricle: The right ventricular size is normal. No increase in right ventricular wall thickness. Right ventricular systolic function is normal. Tricuspid regurgitation signal is inadequate for assessing PA pressure. Left Atrium: Left atrial size was normal in size. Right Atrium: Right atrial size was normal in size. Pericardium: There is a left pleural effusion. Trivial pericardial effusion. Trivial pericardial effusion is present. Mitral Valve: The mitral valve is normal in structure. No evidence of mitral valve regurgitation. No evidence of mitral valve stenosis. Tricuspid Valve: The tricuspid valve is normal in structure. Tricuspid valve regurgitation is not demonstrated. Aortic Valve: The  aortic valve is tricuspid. Aortic valve regurgitation is not visualized. No aortic stenosis is present. Aortic valve mean gradient measures 6.0 mmHg. Aortic valve peak gradient measures 10.2 mmHg. Aortic valve area, by VTI measures 2.12  cm. Pulmonic Valve: The pulmonic valve was normal in structure. Pulmonic valve regurgitation is not visualized. Aorta: The aortic root is normal in size and structure. Venous: The inferior vena cava is normal in size with greater than 50% respiratory variability, suggesting right atrial pressure of 3 mmHg. IAS/Shunts: No atrial level shunt detected by color flow Doppler.  LEFT VENTRICLE PLAX 2D LVIDd:         4.20 cm   Diastology LVIDs:         2.70 cm   LV e' medial:    9.25 cm/s LV PW:         0.90 cm   LV E/e' medial:  12.0 LV IVS:        1.20 cm   LV e' lateral:   14.30 cm/s LVOT diam:     2.10 cm   LV E/e' lateral: 7.8 LV SV:         60 LV SV Index:   32 LVOT Area:     3.46 cm  RIGHT VENTRICLE             IVC RV Basal diam:  3.90 cm     IVC diam: 1.60 cm RV S prime:     14.40 cm/s TAPSE (M-mode): 2.2 cm LEFT ATRIUM             Index        RIGHT ATRIUM           Index LA diam:        3.90 cm 2.05 cm/m   RA Area:     15.70 cm LA Vol (A2C):   51.5 ml 27.03 ml/m  RA Volume:   41.20 ml  21.62 ml/m LA Vol (A4C):   32.8 ml 17.22 ml/m LA Biplane Vol: 41.5 ml 21.78 ml/m  AORTIC VALVE AV Area (Vmax):    2.34 cm AV Area (Vmean):   2.22 cm AV Area (VTI):     2.12 cm AV Vmax:           160.00 cm/s AV Vmean:          111.000 cm/s AV VTI:            0.284 m AV Peak Grad:      10.2 mmHg AV Mean Grad:      6.0 mmHg LVOT Vmax:         108.00 cm/s LVOT Vmean:        71.100 cm/s LVOT VTI:          0.174 m LVOT/AV VTI ratio: 0.61  AORTA Ao Root diam: 2.60 cm MITRAL VALVE MV Area (PHT): 5.02  cm     SHUNTS MV Decel Time: 151 msec     Systemic VTI:  0.17 m MV E velocity: 111.00 cm/s  Systemic Diam: 2.10 cm MV A velocity: 51.40 cm/s MV E/A ratio:  2.16 Dalton McleanMD Electronically signed  by Wilfred Lacy Signature Date/Time: 07/07/2023/9:54:26 AM    Final     Anti-infectives: Anti-infectives (From admission, onward)    Start     Dose/Rate Route Frequency Ordered Stop   07/06/23 2200  vancomycin (VANCOREADY) IVPB 1750 mg/350 mL  Status:  Discontinued        1,750 mg 175 mL/hr over 120 Minutes Intravenous Every 24 hours 07/06/23 0020 07/06/23 1202   07/06/23 0400  piperacillin-tazobactam (ZOSYN) IVPB 3.375 g        3.375 g 12.5 mL/hr over 240 Minutes Intravenous Every 8 hours 07/05/23 2300     07/06/23 0015  vancomycin (VANCOREADY) IVPB 2000 mg/400 mL        2,000 mg 200 mL/hr over 120 Minutes Intravenous  Once 07/05/23 2316 07/06/23 0749   07/05/23 2000  cefTRIAXone (ROCEPHIN) 2 g in sodium chloride 0.9 % 100 mL IVPB        2 g 200 mL/hr over 30 Minutes Intravenous  Once 07/05/23 1949 07/05/23 2130       Assessment/Plan: Gallstone pancreatitis Multifocal pneumonia  -WBC remains normal -LFTs almost normal  Will continue current care including liquids.  Would not advance diet further for now  Plan eventual cholecystectomy once improved from the pancreatitis and pneumonia  Abigail Miyamoto MD 07/08/2023

## 2023-07-08 NOTE — Plan of Care (Signed)

## 2023-07-08 NOTE — Progress Notes (Addendum)
 PROGRESS NOTE    Carli Lefevers  ZOX:[id number] DOB: [birth year] DOA: 07/05/2023 PCP: [facility]  Chief Complaint  Patient presents with   Abdominal Pain   Nausea    Brief Narrative:   Brytnee Bechler is Kion Huntsberry 28 y.o. female with hx obesity, GERD, depression presenting with abdominal pain.  Was seen in the ED [facility] few days prior to admission and found to have pancreatitis.    Assessment & Plan:   Principal Problem:   Acute gallstone pancreatitis Active Problems:   Gallstone pancreatitis  Acute gallstone pancreatitis - denies etoh.  [place] with sludge and stones within gallbladder.   - CT on 3/31 with edematous pancreatitis as well as thickened duodenum adjacent to pancreas (duodenitis vs secondary edema) - will have low threshold to repeat CT abd/pelvis  - LFT's improved today, continue to trend - continue IVF - clears from floor - pain control  - surgery c/s for cholecystectomy - they're continuing to follow   Possible Cholecystitis - continue antibiotics - appreciate surgery c/s, plan for cholecystectomy  Acute Hypoxic Respiratory Failure  Multifocal Pneumonia - currently on abx as noted above - follow urine strep negative, urine legionella pending, MRSA negative, sputum cx if able to collect - ? If related to inflammation from pancreatitis   Bilateral Pleural Effusions - related to pancreatitis  - watch carefully with IVF for si/sx volume overload  Pericardial Effusion Trivial on echo   GERD-continue Protonix   Depression-continue Zoloft  Obesity noted   Insomnia Doxepin, pain management      DVT prophylaxis: SCD, lovenox Code Status: full Family Communication: none Disposition:   Status is: Observation The patient will require care spanning > 2 midnights and should be moved to inpatient because: need for inpatient surgical management   Consultants:  surgery  Procedures:  Echo IMPRESSIONS     1. Left ventricular ejection  fraction, by estimation, is 60 to 65%. The  left ventricle has normal function. The left ventricle has no regional  wall motion abnormalities. Left ventricular diastolic parameters were  normal.   2. Right ventricular systolic function is normal. The right ventricular  size is normal. Tricuspid regurgitation signal is inadequate for assessing  PA pressure.   3. The mitral valve is normal in structure. No evidence of mitral valve  regurgitation. No evidence of mitral stenosis.   4. The aortic valve is tricuspid. Aortic valve regurgitation is not  visualized. No aortic stenosis is present.   5. The inferior vena cava is normal in size with greater than 50%  respiratory variability, suggesting right atrial pressure of 3 mmHg.   6. There is Jabir Dahlem left pleural effusion. Trivial pericardial effusion.   Antimicrobials:  Anti-infectives (From admission, onward)    Start     Dose/Rate Route Frequency Ordered Stop   07/06/23 2200  vancomycin (VANCOREADY) IVPB 1750 mg/350 mL  Status:  Discontinued        1,750 mg 175 mL/hr over 120 Minutes Intravenous Every 24 hours 07/06/23 0020 07/06/23 1202   07/06/23 0400  piperacillin-tazobactam (ZOSYN) IVPB 3.375 g        3.375 g 12.5 mL/hr over 240 Minutes Intravenous Every 8 hours 07/05/23 2300     07/06/23 0015  vancomycin (VANCOREADY) IVPB 2000 mg/400 mL        2,000 mg 200 mL/hr over 120 Minutes Intravenous  Once 07/05/23 2316 07/06/23 0749   07/05/23 2000  cefTRIAXone (ROCEPHIN) 2 g in sodium chloride 0.9 % 100 mL IVPB  2 g 200 mL/hr over 30 Minutes Intravenous  Once 07/05/23 1949 07/05/23 2130       Subjective: Husband at bedside Pain about the same today  Objective: Vitals:   07/07/23 2213 07/08/23 0500 07/08/23 0517 07/08/23 1333  BP: 109/65  114/70 111/70  Pulse: 92  (!) 109 98  Resp: 18  16 18   Temp: 98.2 F (36.8 C)  98.8 F (37.1 C) 98.7 F (37.1 C)  TempSrc: Oral  Oral Oral  SpO2: 94%  100% 96%  Weight:  96.3 kg     Height:        Intake/Output Summary (Last 24 hours) at 07/08/2023 1426 Last data filed at 07/08/2023 0600 Gross per 24 hour  Intake 2571.45 ml  Output 1000 ml  Net 1571.45 ml   Filed Weights   07/05/23 1757 07/08/23 0500  Weight: 90 kg 96.3 kg    Examination:  General: No acute distress. Cardiovascular: RRR Lungs: unlabored, diminsihed Abdomen: continued epigastric and LUQ pain - no rebound Neurological: Alert and oriented 3. Moves all extremities 4 with equal strength. Cranial nerves II through XII grossly intact. Extremities: No clubbing or cyanosis. No edema.  Data Reviewed: I have personally reviewed following labs and imaging studies  CBC: Recent Labs  Lab 07/02/23 2325 07/05/23 1840 07/06/23 0452 07/07/23 0442 07/08/23 0446  WBC 8.3 16.5* 12.0* 9.0 9.9  NEUTROABS  --  13.1*  --  7.1 7.7  HGB 15.4* 14.9 13.7 11.2* 10.7*  HCT 46.9* 46.7* 44.3 37.2 36.0  MCV 89.8 91.9 94.5 96.6 97.3  PLT 193 226 176 134* 163    Basic Metabolic Panel: Recent Labs  Lab 07/02/23 2325 07/05/23 1840 07/06/23 0452 07/07/23 0442 07/08/23 0446  NA 140 138 138 137 140  K 3.9 3.6 3.4* 3.3* 3.4*  CL 106 98 98 100 104  CO2 25 28 27 26 27   GLUCOSE 125* 106* 90 79 77  BUN 13 6 6  <5* <5*  CREATININE 0.65 0.73 0.66 0.38* 0.42*  CALCIUM 9.7 9.0 8.6* 8.0* 8.2*  MG  --   --   --  1.7 1.7  PHOS  --   --   --  1.6* 2.2*    GFR: Estimated Creatinine Clearance: 114.4 mL/min (Aleia Larocca) (by C-G formula based on SCr of 0.42 mg/dL (L)).  Liver Function Tests: Recent Labs  Lab 07/03/23 0342 07/05/23 1840 07/06/23 0452 07/07/23 0442 07/08/23 0446  AST 828* 44* 25 13* 12*  ALT 1,032* 282* 203* 107* 77*  ALKPHOS 127* 103 86 62 67  BILITOT 3.7* 1.2 1.4* 1.0 1.0  PROT 6.5 7.6 6.8 5.2* 5.4*  ALBUMIN 4.3 3.6 2.9* 2.2* 2.2*    CBG: No results for input(s): "GLUCAP" in the last 168 hours.   Recent Results (from the past 240 hours)  Urine Culture     Status: Abnormal   Collection Time:  07/05/23  6:14 PM   Specimen: Urine, Clean Catch  Result Value Ref Range Status   Specimen Description   Final    URINE, CLEAN CATCH Performed at [facility], [address], [place], [place] [address]    Special Requests   Final    NONE Performed at [facility], [address], [place], [place] [address]    Culture (Jaziah Kwasnik)  Final    <10,000 COLONIES/mL INSIGNIFICANT GROWTH Performed at [facility], [address], [place], [place] [address]    Report Status 07/06/2023 FINAL  Final  MRSA Next Gen  by PCR, Nasal     Status: None   Collection Time: 07/06/23  8:25 AM   Specimen: Nasal Mucosa; Nasal Swab  Result Value Ref Range Status   MRSA by PCR Next Gen NOT DETECTED NOT DETECTED Final    Comment: (NOTE) The GeneXpert MRSA Assay (FDA approved for NASAL specimens only), is one component of Agostino Gorin comprehensive MRSA colonization surveillance program. It is not intended to diagnose MRSA infection nor to guide or monitor treatment for MRSA infections. Test performance is not FDA approved in patients less than 28 years old. Performed at [facility], [address], [place], [place] [address]          Radiology Studies: ECHOCARDIOGRAM COMPLETE Result Date: 07/07/2023    ECHOCARDIOGRAM REPORT   Patient Name:   LOTTIE SIGMAN Date of Exam: 07/07/2023 Medical Rec #:  [id number]  Height:       62.0 in Accession #:    [id number] Weight:       198.4 lb Date of Birth:  1995-09-06  BSA:          1.905 m Patient Age:    27 years   BP:           111/66 mmHg Patient Gender: F          HR:           99 bpm. Exam Location:  Inpatient Procedure: 2D Echo, Cardiac Doppler and Color Doppler (Both Spectral and Color            Flow Doppler were utilized during procedure). Indications:    Pericardial Effusion  History:        Patient has no prior history of Echocardiogram examinations.  Sonographer:    Karma Ganja Referring Phys: [id number] Ziad Maye CALDWELL POWELL  JR IMPRESSIONS  1. Left ventricular ejection fraction, by estimation, is 60 to 65%. The left ventricle has normal function. The left ventricle has no regional wall motion abnormalities. Left ventricular diastolic parameters were normal.  2. Right ventricular systolic function is normal. The right ventricular size is normal. Tricuspid regurgitation signal is inadequate for assessing PA pressure.  3. The mitral valve is normal in structure. No evidence of mitral valve regurgitation. No evidence of mitral stenosis.  4. The aortic valve is tricuspid. Aortic valve regurgitation is not visualized. No aortic stenosis is present.  5. The inferior vena cava is normal in size with greater than 50% respiratory variability, suggesting right atrial pressure of 3 mmHg.  6. There is Danity Schmelzer left pleural effusion. Trivial pericardial effusion. FINDINGS  Left Ventricle: Left ventricular ejection fraction, by estimation, is 60 to 65%. The left ventricle has normal function. The left ventricle has no regional wall motion abnormalities. The left ventricular internal cavity size was normal in size. There is  no left ventricular hypertrophy. Left ventricular diastolic parameters were normal. Right Ventricle: The right ventricular size is normal. No increase in right ventricular wall thickness. Right ventricular systolic function is normal. Tricuspid regurgitation signal is inadequate for assessing PA pressure. Left Atrium: Left atrial size was normal in size. Right Atrium: Right atrial size was normal in size. Pericardium: There is Jaylei Fuerte left pleural effusion. Trivial pericardial effusion. Trivial pericardial effusion is present. Mitral Valve: The mitral valve is normal in structure. No evidence of mitral valve regurgitation. No evidence of mitral valve stenosis. Tricuspid Valve: The tricuspid valve is normal in structure. Tricuspid valve regurgitation is not demonstrated. Aortic Valve: The aortic valve is tricuspid. Aortic valve regurgitation is  not visualized. No aortic stenosis is present. Aortic valve mean gradient measures 6.0 mmHg. Aortic valve peak gradient measures 10.2 mmHg. Aortic valve area, by VTI measures 2.12  cm. Pulmonic Valve: The pulmonic valve was normal in structure. Pulmonic valve regurgitation is not visualized. Aorta: The aortic root is normal in size and structure. Venous: The inferior vena cava is normal in size with greater than 50% respiratory variability, suggesting right atrial pressure of 3 mmHg. IAS/Shunts: No atrial level shunt detected by color flow Doppler.  LEFT VENTRICLE PLAX 2D LVIDd:         4.20 cm   Diastology LVIDs:         2.70 cm   LV e' medial:    9.25 cm/s LV PW:         0.90 cm   LV E/e' medial:  12.0 LV IVS:        1.20 cm   LV e' lateral:   14.30 cm/s LVOT diam:     2.10 cm   LV E/e' lateral: 7.8 LV SV:         60 LV SV Index:   32 LVOT Area:     3.46 cm  RIGHT VENTRICLE             IVC RV Basal diam:  3.90 cm     IVC diam: 1.60 cm RV S prime:     14.40 cm/s TAPSE (M-mode): 2.2 cm LEFT ATRIUM             Index        RIGHT ATRIUM           Index LA diam:        3.90 cm 2.05 cm/m   RA Area:     15.70 cm LA Vol (A2C):   51.5 ml 27.03 ml/m  RA Volume:   41.20 ml  21.62 ml/m LA Vol (A4C):   32.8 ml 17.22 ml/m LA Biplane Vol: 41.5 ml 21.78 ml/m  AORTIC VALVE AV Area (Vmax):    2.34 cm AV Area (Vmean):   2.22 cm AV Area (VTI):     2.12 cm AV Vmax:           160.00 cm/s AV Vmean:          111.000 cm/s AV VTI:            0.284 m AV Peak Grad:      10.2 mmHg AV Mean Grad:      6.0 mmHg LVOT Vmax:         108.00 cm/s LVOT Vmean:        71.100 cm/s LVOT VTI:          0.174 m LVOT/AV VTI ratio: 0.61  AORTA Ao Root diam: 2.60 cm MITRAL VALVE MV Area (PHT): 5.02 cm     SHUNTS MV Decel Time: 151 msec     Systemic VTI:  0.17 m MV E velocity: 111.00 cm/s  Systemic Diam: 2.10 cm MV Daymian Lill velocity: 51.40 cm/s MV E/Elverna Caffee ratio:  2.16 Dalton McleanMD Electronically signed by Wilfred Lacy Signature Date/Time: 07/07/2023/9:54:26  AM    Final         Scheduled Meds:  enoxaparin (LOVENOX) injection  40 mg Subcutaneous QHS   pantoprazole  40 mg Oral BID   phosphorus  500 mg Oral QID   sertraline  50 mg Oral Daily   Continuous Infusions:  lactated ringers 125 mL/hr at 07/08/23 0011   piperacillin-tazobactam 3.375 g (07/08/23  1203)     LOS: 2 days    Time spent: over 30 min    Lacretia Nicks, MD [facility]   To contact the attending provider between 7A-7P or the covering provider during after hours 7P-7A, please log into the web site [email or web address] and access using universal [place] password for that web site. If you do not have the password, please call the hospital operator.  07/08/2023, 2:26 PM

## 2023-07-09 ENCOUNTER — Inpatient Hospital Stay (HOSPITAL_COMMUNITY)

## 2023-07-09 DIAGNOSIS — K851 Biliary acute pancreatitis without necrosis or infection: Secondary | ICD-10-CM | POA: Diagnosis not present

## 2023-07-09 LAB — BASIC METABOLIC PANEL WITH GFR
Anion gap: 17 — ABNORMAL HIGH (ref 5–15)
Anion gap: 12 (ref 5–15)
BUN: <5 mg/dL — ABNORMAL LOW (ref 6–20)
BUN: <5 mg/dL — ABNORMAL LOW (ref 6–20)
CO2: 19 mmol/L — ABNORMAL LOW (ref 22–32)
CO2: 24 mmol/L (ref 22–32)
Calcium: 8.7 mg/dL — ABNORMAL LOW (ref 8.9–10.3)
Calcium: 8.1 mg/dL — ABNORMAL LOW (ref 8.9–10.3)
Chloride: 110 mmol/L (ref 98–111)
Chloride: 103 mmol/L (ref 98–111)
Creatinine, Ser: 0.47 mg/dL (ref 0.44–1.00)
Creatinine, Ser: 0.41 mg/dL — ABNORMAL LOW (ref 0.44–1.00)
GFR, Estimated: >60 mL/min (ref >60)
GFR, Estimated: >60 mL/min (ref >60)
Glucose, Bld: 68 mg/dL — ABNORMAL LOW (ref 70–99)
Glucose, Bld: 90 mg/dL (ref 70–99)
Potassium: 3.8 mmol/L (ref 3.5–5.1)
Potassium: 3.5 mmol/L (ref 3.5–5.1)
Sodium: 146 mmol/L — ABNORMAL HIGH (ref 135–145)
Sodium: 139 mmol/L (ref 135–145)

## 2023-07-09 LAB — HEPATIC FUNCTION PANEL
ALT: 56 U/L — ABNORMAL HIGH (ref 0–44)
AST: 14 U/L — ABNORMAL LOW (ref 15–41)
Albumin: 2.2 g/dL — ABNORMAL LOW (ref 3.5–5.0)
Alkaline Phosphatase: 70 U/L (ref 38–126)
Bilirubin, Direct: 0.2 mg/dL (ref 0.0–0.2)
Indirect Bilirubin: 0.9 mg/dL (ref 0.3–0.9)
Total Bilirubin: 1.1 mg/dL (ref 0.0–1.2)
Total Protein: 5.5 g/dL — ABNORMAL LOW (ref 6.5–8.1)

## 2023-07-09 LAB — CREATININE, SERUM
Creatinine, Ser: 0.48 mg/dL (ref 0.44–1.00)
GFR, Estimated: >60 mL/min (ref >60)

## 2023-07-09 MED ORDER — LACTATED RINGERS IV SOLN: INTRAVENOUS | Status: DC

## 2023-07-09 MED ORDER — SODIUM CHLORIDE 0.9 % IV SOLN: INTRAVENOUS | Status: AC | PRN

## 2023-07-09 NOTE — Progress Notes (Signed)
 PROGRESS NOTE    Chloe Rice  ZOX:[id number] DOB: [birth year] DOA: 07/05/2023 PCP: [facility] [place]  Chief Complaint  Patient presents with   Abdominal Pain   Nausea    Brief Narrative:   Chloe Rice is Chloe Rice 28 y.o. female with hx obesity, GERD, depression presenting with abdominal pain.  Was seen in the ED [facility] few days prior to admission and found to have pancreatitis.    Assessment & Plan:   Principal Problem:   Acute gallstone pancreatitis Active Problems:   Gallstone pancreatitis  Acute gallstone pancreatitis - denies etoh.  [place] with sludge and stones within gallbladder.   - CT on 3/31 with edematous pancreatitis as well as thickened duodenum adjacent to pancreas (duodenitis vs secondary edema) - will have low threshold to repeat CT abd/pelvis  - LFT's improved today, continue to trend - continue IVF - clears - pain control - some improvement noted on my exam today - surgery c/s for cholecystectomy - they're continuing to follow   Possible Cholecystitis - continue antibiotics - appreciate surgery c/s, plan for cholecystectomy  Acute Hypoxic Respiratory Failure  Multifocal Pneumonia - currently on abx as noted above - follow urine strep negative, urine legionella pending, MRSA negative, sputum cx if able to collect - ? If related to inflammation from pancreatitis  - CXR 4/6 with bilateral atelectasis or infiltrate  Bilateral Pleural Effusions - related to pancreatitis  - CXR 4/6 with small bilateral effusions - watch carefully with IVF for si/sx volume overload  Pericardial Effusion Trivial on echo   GERD-continue Protonix   Depression-continue Zoloft  Obesity noted   Insomnia Doxepin, pain management      DVT prophylaxis: SCD, lovenox Code Status: full Family Communication: none Disposition:   Status is: Observation The patient will require care spanning > 2 midnights and should be moved to inpatient because: need for inpatient  surgical management   Consultants:  surgery  Procedures:  Echo IMPRESSIONS     1. Left ventricular ejection fraction, by estimation, is 60 to 65%. The  left ventricle has normal function. The left ventricle has no regional  wall motion abnormalities. Left ventricular diastolic parameters were  normal.   2. Right ventricular systolic function is normal. The right ventricular  size is normal. Tricuspid regurgitation signal is inadequate for assessing  PA pressure.   3. The mitral valve is normal in structure. No evidence of mitral valve  regurgitation. No evidence of mitral stenosis.   4. The aortic valve is tricuspid. Aortic valve regurgitation is not  visualized. No aortic stenosis is present.   5. The inferior vena cava is normal in size with greater than 50%  respiratory variability, suggesting right atrial pressure of 3 mmHg.   6. There is Lilyan Prete left pleural effusion. Trivial pericardial effusion.   Antimicrobials:  Anti-infectives (From admission, onward)    Start     Dose/Rate Route Frequency Ordered Stop   07/06/23 2200  vancomycin (VANCOREADY) IVPB 1750 mg/350 mL  Status:  Discontinued        1,750 mg 175 mL/hr over 120 Minutes Intravenous Every 24 hours 07/06/23 0020 07/06/23 1202   07/06/23 0400  piperacillin-tazobactam (ZOSYN) IVPB 3.375 g        3.375 g 12.5 mL/hr over 240 Minutes Intravenous Every 8 hours 07/05/23 2300     07/06/23 0015  vancomycin (VANCOREADY) IVPB 2000 mg/400 mL        2,000 mg 200 mL/hr over 120 Minutes Intravenous  Once 07/05/23  2316 07/06/23 0749   07/05/23 2000  cefTRIAXone (ROCEPHIN) 2 g in sodium chloride 0.9 % 100 mL IVPB        2 g 200 mL/hr over 30 Minutes Intravenous  Once 07/05/23 1949 07/05/23 2130       Subjective: Husband at bedside No new complaints  Objective: Vitals:   07/08/23 1333 07/08/23 2117 07/09/23 0500 07/09/23 0525  BP: 111/70 99/70  108/66  Pulse: 98 84  (!) 107  Resp: 18 18  18   Temp: 98.7 F (37.1 C) 98.6  F (37 C)  99.5 F (37.5 C)  TempSrc: Oral Oral  Oral  SpO2: 96% 100%  98%  Weight:   97.1 kg   Height:        Intake/Output Summary (Last 24 hours) at 07/09/2023 1347 Last data filed at 07/09/2023 0526 Gross per 24 hour  Intake 2939.91 ml  Output 2150 ml  Net 789.91 ml   Filed Weights   07/05/23 1757 07/08/23 0500 07/09/23 0500  Weight: 90 kg 96.3 kg 97.1 kg    Examination:  General: No acute distress. Cardiovascular: RRR Lungs: diminished at bases Abdomen: epigastric pain seems to be improving Neurological: Alert and oriented 3. Moves all extremities 4 with equal strength. Cranial nerves II through XII grossly intact. Extremities: No clubbing or cyanosis. No edema.   Data Reviewed: I have personally reviewed following labs and imaging studies  CBC: Recent Labs  Lab 07/02/23 2325 07/05/23 1840 07/06/23 0452 07/07/23 0442 07/08/23 0446  WBC 8.3 16.5* 12.0* 9.0 9.9  NEUTROABS  --  13.1*  --  7.1 7.7  HGB 15.4* 14.9 13.7 11.2* 10.7*  HCT 46.9* 46.7* 44.3 37.2 36.0  MCV 89.8 91.9 94.5 96.6 97.3  PLT 193 226 176 134* 163    Basic Metabolic Panel: Recent Labs  Lab 07/06/23 0452 07/07/23 0442 07/08/23 0446 07/09/23 0446 07/09/23 1128  NA 138 137 140 146* 139  K 3.4* 3.3* 3.4* 3.8 3.5  CL 98 100 104 110 103  CO2 27 26 27  19* 24  GLUCOSE 90 79 77 68* 90  BUN 6 <5* <5* <5* <5*  CREATININE 0.66 0.38* 0.42* 0.47  0.48 0.41*  CALCIUM 8.6* 8.0* 8.2* 8.7* 8.1*  MG  --  1.7 1.7  --   --   PHOS  --  1.6* 2.2*  --   --     GFR: Estimated Creatinine Clearance: 114.9 mL/min (Rodriguez Aguinaldo) (by C-G formula based on SCr of 0.41 mg/dL (L)).  Liver Function Tests: Recent Labs  Lab 07/05/23 1840 07/06/23 0452 07/07/23 0442 07/08/23 0446 07/09/23 1128  AST 44* 25 13* 12* 14*  ALT 282* 203* 107* 77* 56*  ALKPHOS 103 86 62 67 70  BILITOT 1.2 1.4* 1.0 1.0 1.1  PROT 7.6 6.8 5.2* 5.4* 5.5*  ALBUMIN 3.6 2.9* 2.2* 2.2* 2.2*    CBG: No results for input(s): "GLUCAP" in the  last 168 hours.   Recent Results (from the past 240 hours)  Urine Culture     Status: Abnormal   Collection Time: 07/05/23  6:14 PM   Specimen: Urine, Clean Catch  Result Value Ref Range Status   Specimen Description   Final    URINE, CLEAN CATCH Performed at [facility], [address] [place], [place] [address]    Special Requests   Final    NONE Performed at [facility], [address] [place], [place] [address]    Culture (Yaire Kreher)  Final    <  10,000 COLONIES/mL INSIGNIFICANT GROWTH Performed at [facility], [address] [place], [place] [address]    Report Status 07/06/2023 FINAL  Final  MRSA Next Gen by PCR, Nasal     Status: None   Collection Time: 07/06/23  8:25 AM   Specimen: Nasal Mucosa; Nasal Swab  Result Value Ref Range Status   MRSA by PCR Next Gen NOT DETECTED NOT DETECTED Final    Comment: (NOTE) The GeneXpert MRSA Assay (FDA approved for NASAL specimens only), is one component of Shynice Sigel comprehensive MRSA colonization surveillance program. It is not intended to diagnose MRSA infection nor to guide or monitor treatment for MRSA infections. Test performance is not FDA approved in patients less than 3 years old. Performed at [facility], [address], [place], [place] [address]          Radiology Studies: DG CHEST PORT 1 VIEW Result Date: 07/09/2023 CLINICAL DATA:  Shortness of breath. EXAM: PORTABLE CHEST 1 VIEW COMPARISON:  07/05/2023. FINDINGS: The heart size and mediastinal contours are stable. Lung volumes are low with patchy airspace disease at the lung bases. There are likely small bilateral pleural effusions. No pneumothorax. No acute osseous abnormality. IMPRESSION: 1. Bilateral atelectasis or infiltrate at the lung bases. 2. Small bilateral pleural effusions. Electronically Signed   By: Thornell Sartorius M.D.   On: 07/09/2023 12:38        Scheduled Meds:  doxepin  10 mg Oral QHS    enoxaparin (LOVENOX) injection  40 mg Subcutaneous QHS   pantoprazole  40 mg Oral BID   sertraline  50 mg Oral Daily   Continuous Infusions:  sodium chloride     lactated ringers 125 mL/hr at 07/09/23 0715   piperacillin-tazobactam 3.375 g (07/09/23 1204)     LOS: 3 days    Time spent: over 30 min    Lacretia Nicks, MD [facility]   To contact the attending provider between 7A-7P or the covering provider during after hours 7P-7A, please log into the web site [email or web address] and access using universal [place] password for that web site. If you do not have the password, please call the hospital operator.  07/09/2023, 1:47 PM

## 2023-07-09 NOTE — Progress Notes (Signed)
   Subjective/Chief Complaint: Patient remains on O2 and still getting SOB with activity Continues to improve clinically.     Objective: Vital signs in last 24 hours: Temp:  [98.6 F (37 C)-99.5 F (37.5 C)] 99.5 F (37.5 C) (04/06 0525) Pulse Rate:  [84-107] 107 (04/06 0525) Resp:  [18] 18 (04/06 0525) BP: (99-111)/(66-70) 108/66 (04/06 0525) SpO2:  [96 %-100 %] 98 % (04/06 0525) Weight:  [97.1 kg] 97.1 kg (04/06 0500) Last BM Date : 07/08/23  Intake/Output from previous day: 04/05 0701 - 04/06 0700 In: 3530.8 [P.O.:450; I.V.:2929.2; IV Piggyback:151.6] Out: 3200 [Urine:3200] Intake/Output this shift: No intake/output data recorded.  Exam: Awake and alert Looks comfortable No resp distress, but does get labored with prolonged talking.  Abdomen soft, not distended, mild tenderness in the upper abdomen  Lab Results:  Recent Labs    07/07/23 0442 07/08/23 0446  WBC 9.0 9.9  HGB 11.2* 10.7*  HCT 37.2 36.0  PLT 134* 163   BMET Recent Labs    07/08/23 0446 07/09/23 0446  NA 140 146*  K 3.4* 3.8  CL 104 110  CO2 27 19*  GLUCOSE 77 68*  BUN <5* <5*  CREATININE 0.42* 0.47  0.48  CALCIUM 8.2* 8.7*   PT/INR No results for input(s): "LABPROT", "INR" in the last 72 hours.  ABG No results for input(s): "PHART", "HCO3" in the last 72 hours.  Invalid input(s): "PCO2", "PO2"   Studies/Results: No results found.   Anti-infectives: Anti-infectives (From admission, onward)    Start     Dose/Rate Route Frequency Ordered Stop   07/06/23 2200  vancomycin (VANCOREADY) IVPB 1750 mg/350 mL  Status:  Discontinued        1,750 mg 175 mL/hr over 120 Minutes Intravenous Every 24 hours 07/06/23 0020 07/06/23 1202   07/06/23 0400  piperacillin-tazobactam (ZOSYN) IVPB 3.375 g        3.375 g 12.5 mL/hr over 240 Minutes Intravenous Every 8 hours 07/05/23 2300     07/06/23 0015  vancomycin (VANCOREADY) IVPB 2000 mg/400 mL        2,000 mg 200 mL/hr over 120 Minutes  Intravenous  Once 07/05/23 2316 07/06/23 0749   07/05/23 2000  cefTRIAXone (ROCEPHIN) 2 g in sodium chloride 0.9 % 100 mL IVPB        2 g 200 mL/hr over 30 Minutes Intravenous  Once 07/05/23 1949 07/05/23 2130       Assessment/Plan: Gallstone pancreatitis Multifocal pneumonia  -WBC remains normal -LFTs were normalizing yesterday.   Will continue current care including liquids.  Would not advance diet further for now  Plan eventual cholecystectomy once improved from the pancreatitis and pneumonia Will discuss with anesthesia.    Almond Lint MD 07/09/2023

## 2023-07-09 NOTE — Plan of Care (Signed)
  Problem: Coping: Goal: Level of anxiety will decrease Outcome: Progressing   Problem: Pain Managment: Goal: General experience of comfort will improve and/or be controlled Outcome: Progressing   Problem: Skin Integrity: Goal: Risk for impaired skin integrity will decrease Outcome: Progressing

## 2023-07-10 DIAGNOSIS — K851 Biliary acute pancreatitis without necrosis or infection: Secondary | ICD-10-CM | POA: Diagnosis not present

## 2023-07-10 LAB — CBC WITH DIFFERENTIAL/PLATELET
Abs Immature Granulocytes: 0.14 10*3/uL — ABNORMAL HIGH (ref 0.00–0.07)
Basophils Absolute: 0 10*3/uL (ref 0.0–0.1)
Basophils Relative: 0 %
Eosinophils Absolute: 0.1 10*3/uL (ref 0.0–0.5)
Eosinophils Relative: 1 %
HCT: 37.8 % (ref 36.0–46.0)
Hemoglobin: 11.4 g/dL — ABNORMAL LOW (ref 12.0–15.0)
Immature Granulocytes: 1 %
Lymphocytes Relative: 15 %
Lymphs Abs: 1.6 10*3/uL (ref 0.7–4.0)
MCH: 29.2 pg (ref 26.0–34.0)
MCHC: 30.2 g/dL (ref 30.0–36.0)
MCV: 96.9 fL (ref 80.0–100.0)
Monocytes Absolute: 0.6 10*3/uL (ref 0.1–1.0)
Monocytes Relative: 5 %
Neutro Abs: 8 10*3/uL — ABNORMAL HIGH (ref 1.7–7.7)
Neutrophils Relative %: 78 %
Platelets: 233 10*3/uL (ref 150–400)
RBC: 3.9 MIL/uL (ref 3.87–5.11)
RDW: 15.3 % (ref 11.5–15.5)
WBC: 10.5 10*3/uL (ref 4.0–10.5)
nRBC: 0 % (ref 0.0–0.2)

## 2023-07-10 LAB — COMPREHENSIVE METABOLIC PANEL WITH GFR
ALT: 51 U/L — ABNORMAL HIGH (ref 0–44)
AST: 21 U/L (ref 15–41)
Albumin: 2.5 g/dL — ABNORMAL LOW (ref 3.5–5.0)
Alkaline Phosphatase: 83 U/L (ref 38–126)
Anion gap: 12 (ref 5–15)
BUN: <5 mg/dL — ABNORMAL LOW (ref 6–20)
CO2: 25 mmol/L (ref 22–32)
Calcium: 8.7 mg/dL — ABNORMAL LOW (ref 8.9–10.3)
Chloride: 104 mmol/L (ref 98–111)
Creatinine, Ser: 0.31 mg/dL — ABNORMAL LOW (ref 0.44–1.00)
GFR, Estimated: >60 mL/min (ref >60)
Glucose, Bld: 84 mg/dL (ref 70–99)
Potassium: 3.7 mmol/L (ref 3.5–5.1)
Sodium: 141 mmol/L (ref 135–145)
Total Bilirubin: 1 mg/dL (ref 0.0–1.2)
Total Protein: 6.2 g/dL — ABNORMAL LOW (ref 6.5–8.1)

## 2023-07-10 LAB — MAGNESIUM: Magnesium: 1.6 mg/dL — ABNORMAL LOW (ref 1.7–2.4)

## 2023-07-10 LAB — BRAIN NATRIURETIC PEPTIDE: B Natriuretic Peptide: 142 pg/mL — ABNORMAL HIGH (ref 0.0–100.0)

## 2023-07-10 LAB — PHOSPHORUS: Phosphorus: 2.4 mg/dL — ABNORMAL LOW (ref 2.5–4.6)

## 2023-07-10 MED ORDER — K PHOS MONO-SOD PHOS DI & MONO 155-852-130 MG PO TABS
500.0000 mg | ORAL_TABLET | Freq: Four times a day (QID) | ORAL | Status: AC
  Administered 2023-07-10 (×4): 500 mg via ORAL
  Filled 2023-07-10 (×4): qty 2

## 2023-07-10 MED ORDER — MAGNESIUM SULFATE 2 GM/50ML IV SOLN
2.0000 g | Freq: Once | INTRAVENOUS | Status: AC
  Administered 2023-07-10: 2 g via INTRAVENOUS
  Filled 2023-07-10: qty 50

## 2023-07-10 MED ORDER — MAGNESIUM SULFATE 2 GM/50ML IV SOLN: 2.0000 g | Freq: Once | INTRAVENOUS | Status: DC

## 2023-07-10 NOTE — Progress Notes (Signed)
      Chief Complaint/Subjective: Pain improved though still needed morphine overnight, tolerating liquids well, +flatus  Objective: Vital signs in last 24 hours: Temp:  [98.6 F (37 C)-98.9 F (37.2 C)] 98.9 F (37.2 C) (04/07 0624) Pulse Rate:  [68-104] 104 (04/07 0624) Resp:  [16] 16 (04/07 0624) BP: (108-125)/(73-80) 108/79 (04/07 0624) SpO2:  [96 %-100 %] 100 % (04/07 0624) Weight:  [98.9 kg] 98.9 kg (04/07 0500) Last BM Date : 07/10/23 Intake/Output from previous day: 04/06 0701 - 04/07 0700 In: 2462.9 [P.O.:360; I.V.:1891; IV Piggyback:211.8] Out: 2150 [Urine:2150]  PE: Gen: NAd Resp: on 1l O2 Card: tachycardic Abd: soft, slight pain on left side  Lab Results:  Recent Labs    07/08/23 0446 07/10/23 0439  WBC 9.9 10.5  HGB 10.7* 11.4*  HCT 36.0 37.8  PLT 163 233   Recent Labs    07/09/23 1128 07/10/23 0439  NA 139 141  K 3.5 3.7  CL 103 104  CO2 24 25  GLUCOSE 90 84  BUN <5* <5*  CREATININE 0.41* 0.31*  CALCIUM 8.1* 8.7*   No results for input(s): "LABPROT", "INR" in the last 72 hours.    Component Value Date/Time   NA 141 07/10/2023 0439   K 3.7 07/10/2023 0439   CL 104 07/10/2023 0439   CO2 25 07/10/2023 0439   GLUCOSE 84 07/10/2023 0439   BUN <5 (L) 07/10/2023 0439   CREATININE 0.31 (L) 07/10/2023 0439   CALCIUM 8.7 (L) 07/10/2023 0439   PROT 6.2 (L) 07/10/2023 0439   ALBUMIN 2.5 (L) 07/10/2023 0439   AST 21 07/10/2023 0439   ALT 51 (H) 07/10/2023 0439   ALKPHOS 83 07/10/2023 0439   BILITOT 1.0 07/10/2023 0439   GFRNONAA >60 07/10/2023 0439   GFRAA NOT CALCULATED 03/28/2013 0210    Assessment/Plan Gallstone pancreatitis and pneumonia. Pancreatitis improving, can advance diet. Discussing with team on timing of cholecystectomy  FEN - clear liquids VTE - lovenox ID - [email or web address] Disposition - inpatient   LOS: 4 days   I reviewed last 24 h vitals and pain scores, last 48 h intake and output, last 24 h labs and trends, and last 24 h  imaging results.  This care required moderate level of medical decision making.   De Blanch [facility] 07/10/2023, 9:31 AM Please see Amion for pager number during day hours 7:00am-4:30pm or 7:00am -11:30am on weekends

## 2023-07-10 NOTE — Plan of Care (Signed)
  Problem: Education: Goal: Knowledge of General Education information will improve Description: Including pain rating scale, medication(s)/side effects and non-pharmacologic comfort measures 07/10/2023 0216 by Hilliard Clark, RN Outcome: Progressing 07/10/2023 0216 by Hilliard Clark, RN Reactivated

## 2023-07-10 NOTE — Progress Notes (Signed)
 PROGRESS NOTE    Chloe Rice  LKG:[id number] DOB: [birth year] DOA: 07/05/2023 PCP: [facility] [place]  Chief Complaint  Patient presents with   Abdominal Pain   Nausea    Brief Narrative:   Chloe Rice is Chloe Rice 28 y.o. female with hx obesity, GERD, depression presenting with abdominal pain.  Was seen in the ED Chloe Rice few days prior to admission and found to have pancreatitis.    Assessment & Plan:   Principal Problem:   Acute gallstone pancreatitis Active Problems:   Gallstone pancreatitis  Acute gallstone pancreatitis - denies etoh.  [place] with sludge and stones within gallbladder.   - CT on 3/31 with edematous pancreatitis as well as thickened duodenum adjacent to pancreas (duodenitis vs secondary edema) - will have low threshold to repeat CT abd/pelvis  - LFT's improved today, continue to trend - soft diet - pain control  - surgery c/s for cholecystectomy - they're continuing to follow - sounds like ccx will be outpatient due to her pneumonia  Possible Cholecystitis - continue antibiotics - appreciate surgery c/s, plan for cholecystectomy - this maybe outpatient  Acute Hypoxic Respiratory Failure  Multifocal Pneumonia - currently on RA at rest - currently on abx as noted above - follow urine strep negative, urine legionella negative, MRSA negative, sputum cx if able to collect - ? If related to inflammation from pancreatitis  - CXR 4/6 with bilateral atelectasis or infiltrate  Bilateral Pleural Effusions - related to pancreatitis  - CXR 4/6 with small bilateral effusions - watch carefully with IVF for si/sx volume overload  Pericardial Effusion Trivial on echo   GERD-continue Protonix   Depression-continue Zoloft  Obesity noted   Insomnia Doxepin, pain management      DVT prophylaxis: SCD, lovenox Code Status: full Family Communication: none Disposition:   Status is: Observation The patient will require care spanning > 2 midnights and  should be moved to inpatient because: need for inpatient surgical management   Consultants:  surgery  Procedures:  Echo IMPRESSIONS     1. Left ventricular ejection fraction, by estimation, is 60 to 65%. The  left ventricle has normal function. The left ventricle has no regional  wall motion abnormalities. Left ventricular diastolic parameters were  normal.   2. Right ventricular systolic function is normal. The right ventricular  size is normal. Tricuspid regurgitation signal is inadequate for assessing  PA pressure.   3. The mitral valve is normal in structure. No evidence of mitral valve  regurgitation. No evidence of mitral stenosis.   4. The aortic valve is tricuspid. Aortic valve regurgitation is not  visualized. No aortic stenosis is present.   5. The inferior vena cava is normal in size with greater than 50%  respiratory variability, suggesting right atrial pressure of 3 mmHg.   6. There is Chloe Rice left pleural effusion. Trivial pericardial effusion.   Antimicrobials:  Anti-infectives (From admission, onward)    Start     Dose/Rate Route Frequency Ordered Stop   07/06/23 2200  vancomycin (VANCOREADY) IVPB 1750 mg/350 mL  Status:  Discontinued        1,750 mg 175 mL/hr over 120 Minutes Intravenous Every 24 hours 07/06/23 0020 07/06/23 1202   07/06/23 0400  piperacillin-tazobactam (ZOSYN) IVPB 3.375 g        3.375 g 12.5 mL/hr over 240 Minutes Intravenous Every 8 hours 07/05/23 2300     07/06/23 0015  vancomycin (VANCOREADY) IVPB 2000 mg/400 mL  2,000 mg 200 mL/hr over 120 Minutes Intravenous  Once 07/05/23 2316 07/06/23 0749   07/05/23 2000  cefTRIAXone (ROCEPHIN) 2 g in sodium chloride 0.9 % 100 mL IVPB        2 g 200 mL/hr over 30 Minutes Intravenous  Once 07/05/23 1949 07/05/23 2130       Subjective: Husband, wife at bedside  Continues to have pain and SOB  Objective: Vitals:   07/10/23 0624 07/10/23 1110 07/10/23 1121 07/10/23 1352  BP: 108/79   115/70   Pulse: (!) 104   96  Resp: 16   18  Temp: 98.9 F (37.2 C)   98.6 F (37 C)  TempSrc: Oral   Oral  SpO2: 100% (!) 88% 97% 96%  Weight:      Height:        Intake/Output Summary (Last 24 hours) at 07/10/2023 1458 Last data filed at 07/10/2023 1000 Gross per 24 hour  Intake 2702.85 ml  Output 2950 ml  Net -247.15 ml   Filed Weights   07/08/23 0500 07/09/23 0500 07/10/23 0500  Weight: 96.3 kg 97.1 kg 98.9 kg    Examination:  General: No acute distress. Seen walking around unit after bedside exam.  Cardiovascular: RRR Lungs: unlabored Abdomen: stable epigastric and LUQ pain Neurological: Alert and oriented 3. Moves all extremities 4 with equal strength. Cranial nerves II through XII grossly intact. Extremities: No clubbing or cyanosis. No edema.   Data Reviewed: I have personally reviewed following labs and imaging studies  CBC: Recent Labs  Lab 07/05/23 1840 07/06/23 0452 07/07/23 0442 07/08/23 0446 07/10/23 0439  WBC 16.5* 12.0* 9.0 9.9 10.5  NEUTROABS 13.1*  --  7.1 7.7 8.0*  HGB 14.9 13.7 11.2* 10.7* 11.4*  HCT 46.7* 44.3 37.2 36.0 37.8  MCV 91.9 94.5 96.6 97.3 96.9  PLT 226 176 134* 163 233    Basic Metabolic Panel: Recent Labs  Lab 07/07/23 0442 07/08/23 0446 07/09/23 0446 07/09/23 1128 07/10/23 0439  NA 137 140 146* 139 141  K 3.3* 3.4* 3.8 3.5 3.7  CL 100 104 110 103 104  CO2 26 27 19* 24 25  GLUCOSE 79 77 68* 90 84  BUN <5* <5* <5* <5* <5*  CREATININE 0.38* 0.42* 0.47  0.48 0.41* 0.31*  CALCIUM 8.0* 8.2* 8.7* 8.1* 8.7*  MG 1.7 1.7  --   --  1.6*  PHOS 1.6* 2.2*  --   --  2.4*    GFR: Estimated Creatinine Clearance: 116.1 mL/min (Chloe Rice) (by C-G formula based on SCr of 0.31 mg/dL (L)).  Liver Function Tests: Recent Labs  Lab 07/06/23 0452 07/07/23 0442 07/08/23 0446 07/09/23 1128 07/10/23 0439  AST 25 13* 12* 14* 21  ALT 203* 107* 77* 56* 51*  ALKPHOS 86 62 67 70 83  BILITOT 1.4* 1.0 1.0 1.1 1.0  PROT 6.8 5.2* 5.4* 5.5* 6.2*   ALBUMIN 2.9* 2.2* 2.2* 2.2* 2.5*    CBG: No results for input(s): "GLUCAP" in the last 168 hours.   Recent Results (from the past 240 hours)  Urine Culture     Status: Abnormal   Collection Time: 07/05/23  6:14 PM   Specimen: Urine, Clean Catch  Result Value Ref Range Status   Specimen Description   Final    URINE, CLEAN CATCH Performed at [facility], [address] [place], [place] [address]    Special Requests   Final    NONE Performed at [facility], [address]  [place], [place] [address]    Culture (Bacilio Abascal)  Final    <10,000 COLONIES/mL INSIGNIFICANT GROWTH Performed at [facility], [address], [place], [place] [address]    Report Status 07/06/2023 FINAL  Final  MRSA Next Gen by PCR, Nasal     Status: None   Collection Time: 07/06/23  8:25 AM   Specimen: Nasal Mucosa; Nasal Swab  Result Value Ref Range Status   MRSA by PCR Next Gen NOT DETECTED NOT DETECTED Final    Comment: (NOTE) The GeneXpert MRSA Assay (FDA approved for NASAL specimens only), is one component of Trevelle Mcgurn comprehensive MRSA colonization surveillance program. It is not intended to diagnose MRSA infection nor to guide or monitor treatment for MRSA infections. Test performance is not FDA approved in patients less than 78 years old. Performed at [facility], [address], [place], [place] [address]          Radiology Studies: DG CHEST PORT 1 VIEW Result Date: 07/09/2023 CLINICAL DATA:  Shortness of breath. EXAM: PORTABLE CHEST 1 VIEW COMPARISON:  07/05/2023. FINDINGS: The heart size and mediastinal contours are stable. Lung volumes are low with patchy airspace disease at the lung bases. There are likely small bilateral pleural effusions. No pneumothorax. No acute osseous abnormality. IMPRESSION: 1. Bilateral atelectasis or infiltrate at the lung bases. 2. Small bilateral pleural effusions. Electronically Signed   By: Thornell Sartorius  M.D.   On: 07/09/2023 12:38        Scheduled Meds:  doxepin  10 mg Oral QHS   enoxaparin (LOVENOX) injection  40 mg Subcutaneous QHS   pantoprazole  40 mg Oral BID   phosphorus  500 mg Oral QID   sertraline  50 mg Oral Daily   Continuous Infusions:  piperacillin-tazobactam 3.375 g (07/10/23 1116)     LOS: 4 days    Time spent: over 30 min    Lacretia Nicks, MD [facility]   To contact the attending provider between 7A-7P or the covering provider during after hours 7P-7A, please log into the web site [email or web address] and access using universal [place] password for that web site. If you do not have the password, please call the hospital operator.  07/10/2023, 2:58 PM

## 2023-07-11 DIAGNOSIS — K851 Biliary acute pancreatitis without necrosis or infection: Secondary | ICD-10-CM | POA: Diagnosis not present

## 2023-07-11 LAB — COMPREHENSIVE METABOLIC PANEL WITH GFR
ALT: 41 U/L (ref 0–44)
AST: 25 U/L (ref 15–41)
Albumin: 2.1 g/dL — ABNORMAL LOW (ref 3.5–5.0)
Alkaline Phosphatase: 78 U/L (ref 38–126)
Anion gap: 11 (ref 5–15)
BUN: <5 mg/dL — ABNORMAL LOW (ref 6–20)
CO2: 25 mmol/L (ref 22–32)
Calcium: 8.1 mg/dL — ABNORMAL LOW (ref 8.9–10.3)
Chloride: 106 mmol/L (ref 98–111)
Creatinine, Ser: 0.47 mg/dL (ref 0.44–1.00)
GFR, Estimated: >60 mL/min (ref >60)
Glucose, Bld: 67 mg/dL — ABNORMAL LOW (ref 70–99)
Potassium: 3.3 mmol/L — ABNORMAL LOW (ref 3.5–5.1)
Sodium: 142 mmol/L (ref 135–145)
Total Bilirubin: 0.7 mg/dL (ref 0.0–1.2)
Total Protein: 5.5 g/dL — ABNORMAL LOW (ref 6.5–8.1)

## 2023-07-11 LAB — CBC WITH DIFFERENTIAL/PLATELET
Abs Immature Granulocytes: 0.08 10*3/uL — ABNORMAL HIGH (ref 0.00–0.07)
Basophils Absolute: 0 10*3/uL (ref 0.0–0.1)
Basophils Relative: 1 %
Eosinophils Absolute: 0.1 10*3/uL (ref 0.0–0.5)
Eosinophils Relative: 2 %
HCT: 33.3 % — ABNORMAL LOW (ref 36.0–46.0)
Hemoglobin: 10.2 g/dL — ABNORMAL LOW (ref 12.0–15.0)
Immature Granulocytes: 1 %
Lymphocytes Relative: 15 %
Lymphs Abs: 1.3 10*3/uL (ref 0.7–4.0)
MCH: 29.7 pg (ref 26.0–34.0)
MCHC: 30.6 g/dL (ref 30.0–36.0)
MCV: 96.8 fL (ref 80.0–100.0)
Monocytes Absolute: 0.5 10*3/uL (ref 0.1–1.0)
Monocytes Relative: 6 %
Neutro Abs: 6.3 10*3/uL (ref 1.7–7.7)
Neutrophils Relative %: 75 %
Platelets: 221 10*3/uL (ref 150–400)
RBC: 3.44 MIL/uL — ABNORMAL LOW (ref 3.87–5.11)
RDW: 15.5 % (ref 11.5–15.5)
WBC: 8.4 10*3/uL (ref 4.0–10.5)
nRBC: 0 % (ref 0.0–0.2)

## 2023-07-11 LAB — MAGNESIUM: Magnesium: 2.1 mg/dL (ref 1.7–2.4)

## 2023-07-11 LAB — PHOSPHORUS: Phosphorus: 2.9 mg/dL (ref 2.5–4.6)

## 2023-07-11 MED ORDER — FUROSEMIDE 10 MG/ML IJ SOLN
20.0000 mg | Freq: Once | INTRAMUSCULAR | Status: AC
  Administered 2023-07-11: 20 mg via INTRAVENOUS
  Filled 2023-07-11: qty 2

## 2023-07-11 MED ORDER — NYSTATIN 100000 UNIT/ML MT SUSP
5.0000 mL | Freq: Four times a day (QID) | OROMUCOSAL | Status: AC
  Administered 2023-07-11 – 2023-07-12 (×2): 500000 [IU] via OROMUCOSAL
  Filled 2023-07-11 (×2): qty 5

## 2023-07-11 NOTE — Progress Notes (Signed)
 Follow up arranged with Dr. Derrell Lolling in 2 weeks to discuss elective lap chole once PNA and pulmonary status improve.  Tolerating a soft diet.  No further surgical needs acutely.  Can DC when felt to be medically stable.  Call if any questions arise.  Letha Cape 7:05 AM 07/11/2023

## 2023-07-11 NOTE — Progress Notes (Signed)
 Patient has a white coat on her tongue that looks like thrush. She states she brushed her tongue but it will not go away. NP Garner Nash was notified.

## 2023-07-11 NOTE — Plan of Care (Signed)
   Problem: Education: Goal: Knowledge of General Education information will improve Description: Including pain rating scale, medication(s)/side effects and non-pharmacologic comfort measures Outcome: Progressing   Problem: Pain Managment: Goal: General experience of comfort will improve and/or be controlled Outcome: Progressing   Problem: Safety: Goal: Ability to remain free from injury will improve Outcome: Progressing

## 2023-07-11 NOTE — Progress Notes (Addendum)
 PROGRESS NOTE    Chloe Rice  WUJ:[id number] DOB: 1995/04/08 DOA: 07/05/2023 PCP: [facility] [place]  Chief Complaint  Patient presents with   Abdominal Pain   Nausea    Brief Narrative:   Chloe Rice is Chloe Rice 28 y.o. female with hx obesity, GERD, depression presenting with abdominal pain.  Was seen in the ED Jennette Leask few days prior to admission and found to have pancreatitis.    Assessment & Plan:   Principal Problem:   Acute gallstone pancreatitis Active Problems:   Gallstone pancreatitis  Acute gallstone pancreatitis - denies etoh.  [place] with sludge and stones within gallbladder.   - CT on 3/31 with edematous pancreatitis as well as thickened duodenum adjacent to pancreas (duodenitis vs secondary edema) - will have low threshold to repeat CT abd/pelvis  - LFT's improved today, continue to trend - transition back to full liquids, had some issues with soft diet last night - pain control  - surgery c/s for cholecystectomy - they're continuing to follow - sounds like ccx will be outpatient due to her pneumonia.  They've arranged outpatient follow up in 2 weeks to discuss elective lap chole.   Possible Cholecystitis - continue antibiotics - appreciate surgery c/s, plan for cholecystectomy  outpatient  Acute Hypoxic Respiratory Failure  Multifocal Pneumonia - currently on RA at rest - currently on abx as noted above - follow urine strep negative, urine legionella negative, MRSA negative, sputum cx if able to collect - ? If related to inflammation from pancreatitis  - CXR 4/6 with bilateral atelectasis or infiltrate  Bilateral Pleural Effusions - related to pancreatitis  - CXR 4/6 with small bilateral effusions - mildly elevated BNP, trial of lasix  Pericardial Effusion Trivial on echo   GERD-continue Protonix   Depression-continue Zoloft  Obesity noted   Insomnia Doxepin, pain management   FMLA filled out, placed in paper chart - needs to be faxed.   Wrote for time off from 3/31-4/15/2025.  Will need further evaluation by outpatient provider and additional paperwork      DVT prophylaxis: SCD, lovenox Code Status: full Family Communication: none Disposition:   Status is: Observation The patient will require care spanning > 2 midnights and should be moved to inpatient because: need for inpatient surgical management   Consultants:  surgery  Procedures:  Echo IMPRESSIONS     1. Left ventricular ejection fraction, by estimation, is 60 to 65%. The  left ventricle has normal function. The left ventricle has no regional  wall motion abnormalities. Left ventricular diastolic parameters were  normal.   2. Right ventricular systolic function is normal. The right ventricular  size is normal. Tricuspid regurgitation signal is inadequate for assessing  PA pressure.   3. The mitral valve is normal in structure. No evidence of mitral valve  regurgitation. No evidence of mitral stenosis.   4. The aortic valve is tricuspid. Aortic valve regurgitation is not  visualized. No aortic stenosis is present.   5. The inferior vena cava is normal in size with greater than 50%  respiratory variability, suggesting right atrial pressure of 3 mmHg.   6. There is Chloe Rice left pleural effusion. Trivial pericardial effusion.   Antimicrobials:  Anti-infectives (From admission, onward)    Start     Dose/Rate Route Frequency Ordered Stop   07/06/23 2200  vancomycin (VANCOREADY) IVPB 1750 mg/350 mL  Status:  Discontinued        1,750 mg 175 mL/hr over 120 Minutes Intravenous Every 24 hours  07/06/23 0020 07/06/23 1202   07/06/23 0400  piperacillin-tazobactam (ZOSYN) IVPB 3.375 g        3.375 g 12.5 mL/hr over 240 Minutes Intravenous Every 8 hours 07/05/23 2300     07/06/23 0015  vancomycin (VANCOREADY) IVPB 2000 mg/400 mL        2,000 mg 200 mL/hr over 120 Minutes Intravenous  Once 07/05/23 2316 07/06/23 0749   07/05/23 2000  cefTRIAXone (ROCEPHIN) 2 g in  sodium chloride 0.9 % 100 mL IVPB        2 g 200 mL/hr over 30 Minutes Intravenous  Once 07/05/23 1949 07/05/23 2130       Subjective: Husband at bedside Didn't tolerate soft diet yesterday  Objective: Vitals:   07/11/23 0540 07/11/23 0542 07/11/23 0704 07/11/23 1311  BP: 101/63   104/68  Pulse: 90 90  94  Resp: 18   18  Temp: (!) 97.4 F (36.3 C)   98 F (36.7 C)  TempSrc: Oral   Oral  SpO2:  95%  90%  Weight:   98.8 kg   Height:        Intake/Output Summary (Last 24 hours) at 07/11/2023 1748 Last data filed at 07/11/2023 1500 Gross per 24 hour  Intake 1482.24 ml  Output 4000 ml  Net -2517.76 ml   Filed Weights   07/09/23 0500 07/10/23 0500 07/11/23 0704  Weight: 97.1 kg 98.9 kg 98.8 kg    Examination:  General: No acute distress. Cardiovascular: RRR Lungs: unlabored Abdomen: Soft, nontender, nondistended Neurological: Alert and oriented 3. Moves all extremities 4 with equal strength. Cranial nerves II through XII grossly intact. Extremities: No clubbing or cyanosis. No edema.   Data Reviewed: I have personally reviewed following labs and imaging studies  CBC: Recent Labs  Lab 07/05/23 1840 07/06/23 0452 07/07/23 0442 07/08/23 0446 07/10/23 0439 07/11/23 0450  WBC 16.5* 12.0* 9.0 9.9 10.5 8.4  NEUTROABS 13.1*  --  7.1 7.7 8.0* 6.3  HGB 14.9 13.7 11.2* 10.7* 11.4* 10.2*  HCT 46.7* 44.3 37.2 36.0 37.8 33.3*  MCV 91.9 94.5 96.6 97.3 96.9 96.8  PLT 226 176 134* 163 233 221    Basic Metabolic Panel: Recent Labs  Lab 07/07/23 0442 07/08/23 0446 07/09/23 0446 07/09/23 1128 07/10/23 0439 07/11/23 0450  NA 137 140 146* 139 141 142  K 3.3* 3.4* 3.8 3.5 3.7 3.3*  CL 100 104 110 103 104 106  CO2 26 27 19* 24 25 25   GLUCOSE 79 77 68* 90 84 67*  BUN <5* <5* <5* <5* <5* <5*  CREATININE 0.38* 0.42* 0.47  0.48 0.41* 0.31* 0.47  CALCIUM 8.0* 8.2* 8.7* 8.1* 8.7* 8.1*  MG 1.7 1.7  --   --  1.6* 2.1  PHOS 1.6* 2.2*  --   --  2.4* 2.9     GFR: Estimated Creatinine Clearance: 116.1 mL/min (by C-G formula based on SCr of 0.47 mg/dL).  Liver Function Tests: Recent Labs  Lab 07/07/23 0442 07/08/23 0446 07/09/23 1128 07/10/23 0439 07/11/23 0450  AST 13* 12* 14* 21 25  ALT 107* 77* 56* 51* 41  ALKPHOS 62 67 70 83 78  BILITOT 1.0 1.0 1.1 1.0 0.7  PROT 5.2* 5.4* 5.5* 6.2* 5.5*  ALBUMIN 2.2* 2.2* 2.2* 2.5* 2.1*    CBG: No results for input(s): "GLUCAP" in the last 168 hours.   Recent Results (from the past 240 hours)  Urine Culture     Status: Abnormal   Collection Time: 07/05/23  6:14 PM  Specimen: Urine, Clean Catch  Result Value Ref Range Status   Specimen Description   Final    URINE, CLEAN CATCH Performed at [facility], [address] [place], [place] [address]    Special Requests   Final    NONE Performed at [facility], [address] [place], [place] [address]    Culture (Braddock Servellon)  Final    <10,000 COLONIES/mL INSIGNIFICANT GROWTH Performed at [facility], [address] [place], [place] [address]    Report Status 07/06/2023 FINAL  Final  MRSA Next Gen by PCR, Nasal     Status: None   Collection Time: 07/06/23  8:25 AM   Specimen: Nasal Mucosa; Nasal Swab  Result Value Ref Range Status   MRSA by PCR Next Gen NOT DETECTED NOT DETECTED Final    Comment: (NOTE) The GeneXpert MRSA Assay (FDA approved for NASAL specimens only), is one component of Kenzlie Disch comprehensive MRSA colonization surveillance program. It is not intended to diagnose MRSA infection nor to guide or monitor treatment for MRSA infections. Test performance is not FDA approved in patients less than 26 years old. Performed at [facility], [address] [place], [place] [address]          Radiology Studies: No results found.       Scheduled Meds:  doxepin  10 mg Oral QHS   enoxaparin (LOVENOX) injection  40 mg Subcutaneous QHS   pantoprazole  40 mg Oral  BID   sertraline  50 mg Oral Daily   Continuous Infusions:  piperacillin-tazobactam 3.375 g (07/11/23 1118)     LOS: 5 days    Time spent: over 30 min    Lacretia Nicks, MD [facility]   To contact the attending provider between 7A-7P or the covering provider during after hours 7P-7A, please log into the web site [email or web address] and access using universal [place] password for that web site. If you do not have the password, please call the hospital operator.  07/11/2023, 5:48 PM

## 2023-07-12 ENCOUNTER — Encounter: Payer: Self-pay | Admitting: Internal Medicine

## 2023-07-12 DIAGNOSIS — K851 Biliary acute pancreatitis without necrosis or infection: Secondary | ICD-10-CM | POA: Diagnosis not present

## 2023-07-12 LAB — MAGNESIUM: Magnesium: 1.9 mg/dL (ref 1.7–2.4)

## 2023-07-12 LAB — CBC WITH DIFFERENTIAL/PLATELET
Abs Immature Granulocytes: 0.08 10*3/uL — ABNORMAL HIGH (ref 0.00–0.07)
Basophils Absolute: 0.1 10*3/uL (ref 0.0–0.1)
Basophils Relative: 1 %
Eosinophils Absolute: 0.2 10*3/uL (ref 0.0–0.5)
Eosinophils Relative: 1 %
HCT: 34.6 % — ABNORMAL LOW (ref 36.0–46.0)
Hemoglobin: 10.9 g/dL — ABNORMAL LOW (ref 12.0–15.0)
Immature Granulocytes: 1 %
Lymphocytes Relative: 13 %
Lymphs Abs: 1.6 10*3/uL (ref 0.7–4.0)
MCH: 29.1 pg (ref 26.0–34.0)
MCHC: 31.5 g/dL (ref 30.0–36.0)
MCV: 92.5 fL (ref 80.0–100.0)
Monocytes Absolute: 1 10*3/uL (ref 0.1–1.0)
Monocytes Relative: 8 %
Neutro Abs: 8.9 10*3/uL — ABNORMAL HIGH (ref 1.7–7.7)
Neutrophils Relative %: 76 %
Platelets: 245 10*3/uL (ref 150–400)
RBC: 3.74 MIL/uL — ABNORMAL LOW (ref 3.87–5.11)
RDW: 15.4 % (ref 11.5–15.5)
WBC: 11.7 10*3/uL — ABNORMAL HIGH (ref 4.0–10.5)
nRBC: 0 % (ref 0.0–0.2)

## 2023-07-12 LAB — COMPREHENSIVE METABOLIC PANEL WITH GFR
ALT: 37 U/L (ref 0–44)
AST: 18 U/L (ref 15–41)
Albumin: 2.3 g/dL — ABNORMAL LOW (ref 3.5–5.0)
Alkaline Phosphatase: 79 U/L (ref 38–126)
Anion gap: 13 (ref 5–15)
BUN: <5 mg/dL — ABNORMAL LOW (ref 6–20)
CO2: 24 mmol/L (ref 22–32)
Calcium: 8.3 mg/dL — ABNORMAL LOW (ref 8.9–10.3)
Chloride: 98 mmol/L (ref 98–111)
Creatinine, Ser: 0.52 mg/dL (ref 0.44–1.00)
GFR, Estimated: >60 mL/min (ref >60)
Glucose, Bld: 76 mg/dL (ref 70–99)
Potassium: 3.2 mmol/L — ABNORMAL LOW (ref 3.5–5.1)
Sodium: 135 mmol/L (ref 135–145)
Total Bilirubin: 0.8 mg/dL (ref 0.0–1.2)
Total Protein: 6.1 g/dL — ABNORMAL LOW (ref 6.5–8.1)

## 2023-07-12 LAB — PHOSPHORUS: Phosphorus: 3 mg/dL (ref 2.5–4.6)

## 2023-07-12 MED ORDER — HYDROCODONE-ACETAMINOPHEN 5-325 MG PO TABS: 1.0000 | ORAL_TABLET | ORAL | 0 refills | Status: DC | PRN

## 2023-07-12 MED ORDER — ONDANSETRON 4 MG PO TBDP: 4.0000 mg | ORAL_TABLET | Freq: Three times a day (TID) | ORAL | 0 refills | Status: DC | PRN

## 2023-07-12 MED ORDER — ACETAMINOPHEN 325 MG PO TABS
650.0000 mg | ORAL_TABLET | Freq: Four times a day (QID) | ORAL | Status: AC | PRN
Start: 2023-07-12 — End: ?

## 2023-07-12 NOTE — Hospital Course (Signed)
 Chloe Rice is a 28 y.o. female with hx obesity, GERD, depression presenting with abdominal pain.  Was seen in the ED a few days prior to admission and found to have pancreatitis.     Assessment & Plan:   Principal Problem:   Acute gallstone pancreatitis Active Problems:   Gallstone pancreatitis   Acute gallstone pancreatitis - denies etoh.  [place] with sludge and stones within gallbladder.   - CT on 3/31 with edematous pancreatitis as well as thickened duodenum adjacent to pancreas (duodenitis vs secondary edema) - LFT's improved - continue advancing diet at discharge  -Followed by general surgery; outpatient follow-up planned for cholecystectomy; deferred inpatient due to pneumonia   Acute Hypoxic Respiratory Failure  Multifocal Pneumonia - currently on RA at rest; ambulated with no hypoxia prior to d/c; did have some dyspnea but anticipated to improve slowly (suspect component of deconditioning being in hospital for 6-7 days) - abx course completed inpatient    Bilateral Pleural Effusions - related to pancreatitis  - CXR 4/6 with small bilateral effusions - mildly elevated BNP, trial of lasix   Pericardial Effusion Trivial on echo    GERD-continue Protonix   Depression-continue Zoloft   Obesity noted   Insomnia Doxepin, pain management

## 2023-07-12 NOTE — Discharge Summary (Signed)
 Physician Discharge Summary   [name] UJW:[id number] DOB: 07-20-1995 DOA: 07/05/2023  PCP: [facility] [place]  Admit date: 07/05/2023 Discharge date: 07/12/2023   Admitted From: Home  Disposition:  Home  Discharging physician: Lewie Chamber, MD Barriers to discharge: none  Recommendations at discharge: Follow up with surgery to discuss CCY timing   Discharge Condition: stable CODE STATUS: Full  Diet recommendation:  Diet Orders (From admission, onward)     Start     Ordered   07/11/23 1618  Diet full liquid Room service appropriate? Yes; Fluid consistency: Thin  Diet effective now       Question Answer Comment  Room service appropriate? Yes   Fluid consistency: Thin      07/11/23 1617            Hospital Course: Chloe Rice is a 28 y.o. female with hx obesity, GERD, depression presenting with abdominal pain.  Was seen in the ED a few days prior to admission and found to have pancreatitis.     Assessment & Plan:   Principal Problem:   Acute gallstone pancreatitis Active Problems:   Gallstone pancreatitis   Acute gallstone pancreatitis - denies etoh.  [place] with sludge and stones within gallbladder.   - CT on 3/31 with edematous pancreatitis as well as thickened duodenum adjacent to pancreas (duodenitis vs secondary edema) - LFT's improved - continue advancing diet at discharge  -Followed by general surgery; outpatient follow-up planned for cholecystectomy; deferred inpatient due to pneumonia   Acute Hypoxic Respiratory Failure  Multifocal Pneumonia - currently on RA at rest; ambulated with no hypoxia prior to d/c; did have some dyspnea but anticipated to improve slowly (suspect component of deconditioning being in hospital for 6-7 days) - abx course completed inpatient    Bilateral Pleural Effusions - related to pancreatitis  - CXR 4/6 with small bilateral effusions - mildly elevated BNP, trial of lasix   Pericardial Effusion Trivial on  echo    GERD-continue Protonix   Depression-continue Zoloft   Obesity noted   Insomnia Doxepin, pain management   The patient's acute and chronic medical conditions were treated accordingly. On day of discharge, patient was felt deemed stable for discharge. Patient/family member advised to call PCP or come back to ER if needed.   Principal Diagnosis: Acute gallstone pancreatitis  Discharge Diagnoses: Active Hospital Problems   Diagnosis Date Noted   Acute gallstone pancreatitis 07/05/2023    Priority: 1.   Gallstone pancreatitis 07/06/2023    Resolved Hospital Problems  No resolved problems to display.     Discharge Instructions     Increase activity slowly   Complete by: As directed       Allergies as of 07/12/2023       Reactions   Gluten Meal Other (See Comments)   CELIAC DISEASE and Abdominal Pain   Oxycodone Anxiety, Other (See Comments)   Uncontrollable shaking (advised not to consume) and muscle spasms, also        Medication List     STOP taking these medications    HYDROcodone-homatropine 5-1.5 MG/5ML syrup Commonly known as: HYCODAN       TAKE these medications    acetaminophen 325 MG tablet Commonly known as: TYLENOL Take 2 tablets (650 mg total) by mouth every 6 (six) hours as needed for mild pain (pain score 1-3) (or Fever >/= 101).   HYDROcodone-acetaminophen 5-325 MG tablet Commonly known as: NORCO/VICODIN Take 1 tablet by mouth every 4 (four) hours as  needed for moderate pain (pain score 4-6). What changed: reasons to take this   hydrOXYzine 50 MG tablet Commonly known as: ATARAX Take 25-50 mg by mouth daily as needed (for sleep or panic).   medroxyPROGESTERone 150 MG/ML injection Commonly known as: DEPO-PROVERA INJECT 1 ML INTO THE MUSCLE AS DIRECTED   ondansetron 4 MG disintegrating tablet Commonly known as: ZOFRAN-ODT Take 1 tablet (4 mg total) by mouth every 8 (eight) hours as needed for nausea or vomiting. What changed:  reasons to take this   pantoprazole 40 MG tablet Commonly known as: PROTONIX Take 1 tablet (40 mg total) by mouth daily.   sertraline 50 MG tablet Commonly known as: ZOLOFT Take 50 mg by mouth daily.   Slynd 4 MG Tabs Generic drug: Drospirenone Take 4 mg by mouth daily.        Follow-up Information     Axel Filler, MD Follow up on 07/24/2023.   Specialty: General Surgery Why: 11:20am, Arrive 30 minutes prior to your appointment time, Please bring your insurance card and photo ID Contact information: [address] [place] [address] [phone]                Allergies  Allergen Reactions   Gluten Meal Other (See Comments)    CELIAC DISEASE and Abdominal Pain   Oxycodone Anxiety and Other (See Comments)    Uncontrollable shaking (advised not to consume) and muscle spasms, also    Consultations: General surgery   Procedures:   Discharge Exam: BP 111/74 (BP Location: Right Arm)   Pulse 95   Temp 98.3 F (36.8 C) (Oral)   Resp 18   Ht 5\' 2"  (1.575 m)   Wt 98.9 kg   LMP 04/19/2023 (Approximate)   SpO2 94%   BMI 39.88 kg/m  Physical Exam Constitutional:      Appearance: Normal appearance.  HENT:     Head: Normocephalic and atraumatic.     Mouth/Throat:     Mouth: Mucous membranes are moist.  Eyes:     Extraocular Movements: Extraocular movements intact.  Cardiovascular:     Rate and Rhythm: Normal rate and regular rhythm.  Pulmonary:     Effort: Pulmonary effort is normal. No respiratory distress.     Breath sounds: Normal breath sounds. No wheezing.  Abdominal:     General: Bowel sounds are normal. There is no distension.     Palpations: Abdomen is soft.     Tenderness: There is abdominal tenderness (mild and generalized).  Musculoskeletal:        General: Normal range of motion.     Cervical back: Normal range of motion and neck supple.  Skin:    General: Skin is warm and dry.  Neurological:     General: No focal  deficit present.     Mental Status: She is alert.  Psychiatric:        Mood and Affect: Mood normal.      The results of significant diagnostics from this hospitalization (including imaging, microbiology, ancillary and laboratory) are listed below for reference.   Microbiology: Recent Results (from the past 240 hours)  Urine Culture     Status: Abnormal   Collection Time: 07/05/23  6:14 PM   Specimen: Urine, Clean Catch  Result Value Ref Range Status   Specimen Description   Final    URINE, CLEAN CATCH Performed at [facility], [address], [place], [place] [address]    Special Requests  Final    NONE Performed at [facility], [address] [place], [place] [address]    Culture (A)  Final    <10,000 COLONIES/mL INSIGNIFICANT GROWTH Performed at [facility], [address] [place], [place] [address]    Report Status 07/06/2023 FINAL  Final  MRSA Next Gen by PCR, Nasal     Status: None   Collection Time: 07/06/23  8:25 AM   Specimen: Nasal Mucosa; Nasal Swab  Result Value Ref Range Status   MRSA by PCR Next Gen NOT DETECTED NOT DETECTED Final    Comment: (NOTE) The GeneXpert MRSA Assay (FDA approved for NASAL specimens only), is one component of a comprehensive MRSA colonization surveillance program. It is not intended to diagnose MRSA infection nor to guide or monitor treatment for MRSA infections. Test performance is not FDA approved in patients less than 9 years old. Performed at [facility], [address] [place], [place] [address]      Labs: BNP (last 3 results) Recent Labs    07/10/23 1733  BNP 142.0*   Basic Metabolic Panel: Recent Labs  Lab 07/07/23 0442 07/08/23 0446 07/09/23 0446 07/09/23 1128 07/10/23 0439 07/11/23 0450 07/12/23 0436  NA 137 140 146* 139 141 142 135  K 3.3* 3.4* 3.8 3.5 3.7 3.3* 3.2*  CL 100 104 110 103 104 106 98  CO2 26 27 19* 24 25 25 24   GLUCOSE 79  77 68* 90 84 67* 76  BUN <5* <5* <5* <5* <5* <5* <5*  CREATININE 0.38* 0.42* 0.47  0.48 0.41* 0.31* 0.47 0.52  CALCIUM 8.0* 8.2* 8.7* 8.1* 8.7* 8.1* 8.3*  MG 1.7 1.7  --   --  1.6* 2.1 1.9  PHOS 1.6* 2.2*  --   --  2.4* 2.9 3.0   Liver Function Tests: Recent Labs  Lab 07/08/23 0446 07/09/23 1128 07/10/23 0439 07/11/23 0450 07/12/23 0436  AST 12* 14* 21 25 18   ALT 77* 56* 51* 41 37  ALKPHOS 67 70 83 78 79  BILITOT 1.0 1.1 1.0 0.7 0.8  PROT 5.4* 5.5* 6.2* 5.5* 6.1*  ALBUMIN 2.2* 2.2* 2.5* 2.1* 2.3*   Recent Labs  Lab 07/05/23 1840  LIPASE 74*   No results for input(s): "AMMONIA" in the last 168 hours. CBC: Recent Labs  Lab 07/07/23 0442 07/08/23 0446 07/10/23 0439 07/11/23 0450 07/12/23 0436  WBC 9.0 9.9 10.5 8.4 11.7*  NEUTROABS 7.1 7.7 8.0* 6.3 8.9*  HGB 11.2* 10.7* 11.4* 10.2* 10.9*  HCT 37.2 36.0 37.8 33.3* 34.6*  MCV 96.6 97.3 96.9 96.8 92.5  PLT 134* 163 233 221 245   Cardiac Enzymes: No results for input(s): "CKTOTAL", "CKMB", "CKMBINDEX", "TROPONINI" in the last 168 hours. BNP: Invalid input(s): "POCBNP" CBG: No results for input(s): "GLUCAP" in the last 168 hours. D-Dimer No results for input(s): "DDIMER" in the last 72 hours. Hgb A1c No results for input(s): "HGBA1C" in the last 72 hours. Lipid Profile No results for input(s): "CHOL", "HDL", "LDLCALC", "TRIG", "CHOLHDL", "LDLDIRECT" in the last 72 hours. Thyroid function studies No results for input(s): "TSH", "T4TOTAL", "T3FREE", "THYROIDAB" in the last 72 hours.  Invalid input(s): "FREET3" Anemia work up No results for input(s): "VITAMINB12", "FOLATE", "FERRITIN", "TIBC", "IRON", "RETICCTPCT" in the last 72 hours. Urinalysis    Component Value Date/Time   COLORURINE YELLOW 07/05/2023 1814   APPEARANCEUR HAZY (A) 07/05/2023 1814   LABSPEC 1.015 07/05/2023 1814   PHURINE 5.0 07/05/2023 1814   GLUCOSEU NEGATIVE 07/05/2023  1814   HGBUR LARGE (A) 07/05/2023 1814   BILIRUBINUR NEGATIVE  07/05/2023 1814   KETONESUR 20 (A) 07/05/2023 1814   PROTEINUR 30 (A) 07/05/2023 1814   UROBILINOGEN 0.2 03/28/2013 0254   NITRITE NEGATIVE 07/05/2023 1814   LEUKOCYTESUR NEGATIVE 07/05/2023 1814   Sepsis Labs Recent Labs  Lab 07/08/23 0446 07/10/23 0439 07/11/23 0450 07/12/23 0436  WBC 9.9 10.5 8.4 11.7*   Microbiology Recent Results (from the past 240 hours)  Urine Culture     Status: Abnormal   Collection Time: 07/05/23  6:14 PM   Specimen: Urine, Clean Catch  Result Value Ref Range Status   Specimen Description   Final    URINE, CLEAN CATCH Performed at [facility], [address] [place], [place] [address]    Special Requests   Final    NONE Performed at [facility], [address], [place], [place] [address]    Culture (A)  Final    <10,000 COLONIES/mL INSIGNIFICANT GROWTH Performed at [facility], [address], [place], [place] [address]    Report Status 07/06/2023 FINAL  Final  MRSA Next Gen by PCR, Nasal     Status: None   Collection Time: 07/06/23  8:25 AM   Specimen: Nasal Mucosa; Nasal Swab  Result Value Ref Range Status   MRSA by PCR Next Gen NOT DETECTED NOT DETECTED Final    Comment: (NOTE) The GeneXpert MRSA Assay (FDA approved for NASAL specimens only), is one component of a comprehensive MRSA colonization surveillance program. It is not intended to diagnose MRSA infection nor to guide or monitor treatment for MRSA infections. Test performance is not FDA approved in patients less than 23 years old. Performed at [facility], [address], [place], [place] [address]     Procedures/Studies: DG CHEST PORT 1 VIEW Result Date: 07/09/2023 CLINICAL DATA:  Shortness of breath. EXAM: PORTABLE CHEST 1 VIEW COMPARISON:  07/05/2023. FINDINGS: The heart size and mediastinal contours are stable. Lung volumes are low with patchy airspace disease at the lung bases. There are likely small  bilateral pleural effusions. No pneumothorax. No acute osseous abnormality. IMPRESSION: 1. Bilateral atelectasis or infiltrate at the lung bases. 2. Small bilateral pleural effusions. Electronically Signed   By: Thornell Sartorius M.D.   On: 07/09/2023 12:38   ECHOCARDIOGRAM COMPLETE Result Date: 07/07/2023    ECHOCARDIOGRAM REPORT   Patient Name:   Chloe Rice Date of Exam: 07/07/2023 Medical Rec #:  [id number]  Height:       62.0 in Accession #:    [id number] Weight:       198.4 lb Date of Birth:  [birth year]  BSA:          1.905 m Patient Age:    27 years   BP:           111/66 mmHg Patient Gender: F          HR:           99 bpm. Exam Location:  Inpatient Procedure: 2D Echo, Cardiac Doppler and Color Doppler (Both Spectral and Color            Flow Doppler were utilized during procedure). Indications:    Pericardial Effusion  History:        Patient has no prior history of Echocardiogram examinations.  Sonographer:    Karma Ganja Referring Phys: [phone] A CALDWELL POWELL JR IMPRESSIONS  1. Left ventricular ejection fraction, by estimation, is 60  to 65%. The left ventricle has normal function. The left ventricle has no regional wall motion abnormalities. Left ventricular diastolic parameters were normal.  2. Right ventricular systolic function is normal. The right ventricular size is normal. Tricuspid regurgitation signal is inadequate for assessing PA pressure.  3. The mitral valve is normal in structure. No evidence of mitral valve regurgitation. No evidence of mitral stenosis.  4. The aortic valve is tricuspid. Aortic valve regurgitation is not visualized. No aortic stenosis is present.  5. The inferior vena cava is normal in size with greater than 50% respiratory variability, suggesting right atrial pressure of 3 mmHg.  6. There is a left pleural effusion. Trivial pericardial effusion. FINDINGS  Left Ventricle: Left ventricular ejection fraction, by estimation, is 60 to 65%. The left ventricle has normal function. The  left ventricle has no regional wall motion abnormalities. The left ventricular internal cavity size was normal in size. There is  no left ventricular hypertrophy. Left ventricular diastolic parameters were normal. Right Ventricle: The right ventricular size is normal. No increase in right ventricular wall thickness. Right ventricular systolic function is normal. Tricuspid regurgitation signal is inadequate for assessing PA pressure. Left Atrium: Left atrial size was normal in size. Right Atrium: Right atrial size was normal in size. Pericardium: There is a left pleural effusion. Trivial pericardial effusion. Trivial pericardial effusion is present. Mitral Valve: The mitral valve is normal in structure. No evidence of mitral valve regurgitation. No evidence of mitral valve stenosis. Tricuspid Valve: The tricuspid valve is normal in structure. Tricuspid valve regurgitation is not demonstrated. Aortic Valve: The aortic valve is tricuspid. Aortic valve regurgitation is not visualized. No aortic stenosis is present. Aortic valve mean gradient measures 6.0 mmHg. Aortic valve peak gradient measures 10.2 mmHg. Aortic valve area, by VTI measures 2.12  cm. Pulmonic Valve: The pulmonic valve was normal in structure. Pulmonic valve regurgitation is not visualized. Aorta: The aortic root is normal in size and structure. Venous: The inferior vena cava is normal in size with greater than 50% respiratory variability, suggesting right atrial pressure of 3 mmHg. IAS/Shunts: No atrial level shunt detected by color flow Doppler.  LEFT VENTRICLE PLAX 2D LVIDd:         4.20 cm   Diastology LVIDs:         2.70 cm   LV e' medial:    9.25 cm/s LV PW:         0.90 cm   LV E/e' medial:  12.0 LV IVS:        1.20 cm   LV e' lateral:   14.30 cm/s LVOT diam:     2.10 cm   LV E/e' lateral: 7.8 LV SV:         60 LV SV Index:   32 LVOT Area:     3.46 cm  RIGHT VENTRICLE             IVC RV Basal diam:  3.90 cm     IVC diam: 1.60 cm RV S prime:      14.40 cm/s TAPSE (M-mode): 2.2 cm LEFT ATRIUM             Index        RIGHT ATRIUM           Index LA diam:        3.90 cm 2.05 cm/m   RA Area:     15.70 cm LA Vol (A2C):   51.5 ml 27.03 ml/m  RA Volume:  41.20 ml  21.62 ml/m LA Vol (A4C):   32.8 ml 17.22 ml/m LA Biplane Vol: 41.5 ml 21.78 ml/m  AORTIC VALVE AV Area (Vmax):    2.34 cm AV Area (Vmean):   2.22 cm AV Area (VTI):     2.12 cm AV Vmax:           160.00 cm/s AV Vmean:          111.000 cm/s AV VTI:            0.284 m AV Peak Grad:      10.2 mmHg AV Mean Grad:      6.0 mmHg LVOT Vmax:         108.00 cm/s LVOT Vmean:        71.100 cm/s LVOT VTI:          0.174 m LVOT/AV VTI ratio: 0.61  AORTA Ao Root diam: 2.60 cm MITRAL VALVE MV Area (PHT): 5.02 cm     SHUNTS MV Decel Time: 151 msec     Systemic VTI:  0.17 m MV E velocity: 111.00 cm/s  Systemic Diam: 2.10 cm MV A velocity: 51.40 cm/s MV E/A ratio:  2.16 Dalton McleanMD Electronically signed by Wilfred Lacy Signature Date/Time: 07/07/2023/9:54:26 AM    Final    CT Angio Chest Pulmonary Embolism (PE) W or WO Contrast Result Date: 07/05/2023 CLINICAL DATA:  PE suspected. Pancreatitis diagnosed on Sunday. Low oxygen saturations. EXAM: CT ANGIOGRAPHY CHEST WITH CONTRAST TECHNIQUE: Multidetector CT imaging of the chest was performed using the standard protocol during bolus administration of intravenous contrast. Multiplanar CT image reconstructions and MIPs were obtained to evaluate the vascular anatomy. RADIATION DOSE REDUCTION: This exam was performed according to the departmental dose-optimization program which includes automated exposure control, adjustment of the mA and/or kV according to patient size and/or use of iterative reconstruction technique. CONTRAST:  75mL OMNIPAQUE IOHEXOL 350 MG/ML SOLN COMPARISON:  Same day radiograph and CT 07/03/2023 FINDINGS: Cardiovascular: Small pericardial effusion, increased from 07/03/2023. Normal caliber thoracic aorta. Negative for acute pulmonary  embolism. Mediastinum/Nodes: Trachea is unremarkable. Unremarkable esophagus. No thoracic adenopathy. Lungs/Pleura: Bilateral patchy ground-glass opacities in the upper and right middle lobes. Dense consolidation in the bilateral lower lobes and lingula with some low-density fluid. Trace right and small left pleural effusions. No pneumothorax. Upper Abdomen: Inflammatory stranding and edema about the pancreas. No ductal dilation or organized fluid collection. Musculoskeletal: No acute fracture. Review of the MIP images confirms the above findings. IMPRESSION: 1. Negative for acute pulmonary embolism. 2. Findings suggest multifocal pneumonia. 3. Trace right and small left pleural effusions. 4. Small pericardial effusion, new since 07/03/2023. 5. Similar acute pancreatitis and duodenitis Electronically Signed   By: Minerva Fester M.D.   On: 07/05/2023 22:46   US Abdomen Limited RUQ (LIVER/GB) Result Date: 07/05/2023 CLINICAL DATA:  Pancreatitis EXAM: ULTRASOUND ABDOMEN LIMITED RIGHT UPPER QUADRANT COMPARISON:  CT 07/03/2023 FINDINGS: Gallbladder: Stones and sludge within the gallbladder. Stones measure up to 6 mm. Wall thickening measuring 4 mm. Negative sonographic Murphy sign. Common bile duct: Diameter: Normal caliber, 3 mm. Liver: No focal lesion identified. Within normal limits in parenchymal echogenicity. Portal vein is patent on color Doppler imaging with normal direction of blood flow towards the liver. Other: None. IMPRESSION: Sludge and stones within the gallbladder. Mild gallbladder wall thickening. Appearance is concerning for possible cholecystitis. Recommend clinical correlation. Electronically Signed   By: Charlett Nose M.D.   On: 07/05/2023 20:13   DG Chest 2 View Result Date: 07/05/2023  CLINICAL DATA:  Shortness of breath.  Acute pancreatitis. EXAM: CHEST - 2 VIEW COMPARISON:  07/02/2023 FINDINGS: The heart size and mediastinal contours are within normal limits. Low lung volumes are now seen with  development of bibasilar atelectasis. Probable small left pleural effusion also noted. IMPRESSION: Bibasilar atelectasis, and probable small left pleural effusion. Electronically Signed   By: Danae Orleans M.D.   On: 07/05/2023 20:05   CT Angio Chest/Abd/Pel for Dissection W and/or Wo Contrast Result Date: 07/03/2023 CLINICAL DATA:  Back pain for 4 days.  Aortic aneurysm suspected EXAM: CT ANGIOGRAPHY CHEST, ABDOMEN AND PELVIS TECHNIQUE: Non-contrast CT of the chest was initially obtained. Multidetector CT imaging through the chest, abdomen and pelvis was performed using the standard protocol during bolus administration of intravenous contrast. Multiplanar reconstructed images and MIPs were obtained and reviewed to evaluate the vascular anatomy. RADIATION DOSE REDUCTION: This exam was performed according to the departmental dose-optimization program which includes automated exposure control, adjustment of the mA and/or kV according to patient size and/or use of iterative reconstruction technique. CONTRAST:  OMNIPAQUE IOHEXOL 350 MG/ML SOLN COMPARISON:  None Available. FINDINGS: CTA CHEST FINDINGS Cardiovascular: Preferential opacification of the thoracic aorta. No evidence of thoracic aortic aneurysm or dissection. Normal heart size. No pericardial effusion. No pulmonary artery filling defects. Mediastinum/Nodes: Anterior mediastinal soft tissue attributed to thymus, rounded superiorly but homogeneous and without clear mass. No adenopathy or hematoma Lungs/Pleura: Dependent reticulation with mild volume loss attributed to atelectasis. Musculoskeletal: No acute finding Review of the MIP images confirms the above findings. CTA ABDOMEN AND PELVIS FINDINGS VASCULAR Aorta: Normal Celiac: Unremarkable SMA: Unremarkable Renals: Unremarkable IMA: Patent Inflow: Unremarkable Veins: Unremarkable for arterial timing Review of the MIP images confirms the above findings. NON-VASCULAR Hepatobiliary: No focal liver  abnormality.No evidence of biliary obstruction or stone. Pancreas: Diffuse expansion and peripancreatic stranding. No organized collection or necrosis seen. Spleen: Unremarkable. Adrenals/Urinary Tract: Negative adrenals. No hydronephrosis or stone. Unremarkable bladder. Stomach/Bowel: Low-density thickened appearance of the duodenum adjacent to the pancreas without visible ulcer. Lymphatic: No mass or adenopathy. Reproductive:No pathologic findings. Other: No ascites or pneumoperitoneum. Musculoskeletal: No acute abnormalities. Review of the MIP images confirms the above findings. IMPRESSION: 1. Acute edematous pancreatitis. 2. Thickened duodenum adjacent to the pancreas which could be duodenitis or secondary edema. 3. Normal aorta. Electronically Signed   By: Tiburcio Pea M.D.   On: 07/03/2023 04:51   DG Chest 2 View Result Date: 07/02/2023 CLINICAL DATA:  chest pain EXAM: CHEST - 2 VIEW COMPARISON:  06/13/2018. FINDINGS: Cardiac silhouette is unremarkable. No pneumothorax or pleural effusion. The lungs are clear. The visualized skeletal structures are unremarkable. IMPRESSION: No acute cardiopulmonary process. Electronically Signed   By: Layla Maw M.D.   On: 07/02/2023 23:50     Time coordinating discharge: Over 30 minutes    Lewie Chamber, MD  [facility] 07/12/2023, 5:24 PM

## 2023-07-12 NOTE — Plan of Care (Signed)
  Problem: Education: Goal: Knowledge of General Education information will improve Description: Including pain rating scale, medication(s)/side effects and non-pharmacologic comfort measures Outcome: Progressing   Problem: Pain Managment: Goal: General experience of comfort will improve and/or be controlled Outcome: Progressing

## 2023-07-12 NOTE — TOC Progression Note (Signed)
 Transition of Care [facility]) - Progression Note    Patient Details  Name: Chloe Rice MRN: [id number] Date of Birth: 10/21/1995  [facility] CM/SW Contact  Howell Rucks, RN Phone Number: 07/12/2023, 12:07 PM  Clinical Narrative:   Patient remains on iv abx for Multifocal Pneumonia and Acute Cholecystitis, not medically stable for dc. TOC will continue to follow.          Expected Discharge Plan and Services                                               Social Determinants of Health (SDOH) Interventions SDOH Screenings   Food Insecurity: No Food Insecurity (07/06/2023)  Housing: Low Risk  (07/06/2023)  Transportation Needs: No Transportation Needs (07/06/2023)  Utilities: Not At Risk (07/06/2023)  Social Connections: Unknown (08/17/2021)   Received from [facility]  Tobacco Use: Low Risk  (07/05/2023)    Readmission Risk Interventions     No data to display

## 2023-07-12 NOTE — Progress Notes (Signed)
 Patient discharged home, IV removed, discharge paperwork provided and explained to patient, patient verbalized understanding.

## 2023-07-24 ENCOUNTER — Ambulatory Visit: Payer: Self-pay | Admitting: General Surgery

## 2023-07-24 NOTE — H&P (Signed)
 Chief Complaint: New Consultation (gallstone pancreatitis)       History of Present Illness: Chloe Rice is a 28 y.o. female who is seen today as an office consultation at the request of Dr. Trellis Fries for evaluation of New Consultation (gallstone pancreatitis) .   History of Present Illness The patient, with a history of pancreatitis, gallstones, and pneumonia, reports feeling better since her hospital discharge. She had experienced low blood pressure, difficulty eating, and breathing problems immediately after discharge, but these symptoms have since improved. The patient still experiences shortness of breath when exerting herself, such as when climbing stairs too quickly. She can eat, but not in large quantities. Overeating or eating too quickly can cause abdominal pain. The patient was sent home with Zofran , which she stopped taking two days ago, and Vicodin, which she no longer needs. The patient is eager to return to normal eating habits and is concerned about the recurrence of pancreatitis.         Review of Systems: A complete review of systems was obtained from the patient.  I have reviewed this information and discussed as appropriate with the patient.  See HPI as well for other ROS.   Review of Systems  Constitutional:  Negative for fever.  HENT:  Negative for congestion.   Eyes:  Negative for blurred vision.  Respiratory:  Negative for cough, shortness of breath and wheezing.   Cardiovascular:  Negative for chest pain and palpitations.  Gastrointestinal:  Positive for abdominal pain and nausea. Negative for heartburn and vomiting.  Genitourinary:  Negative for dysuria.  Musculoskeletal:  Negative for myalgias.  Skin:  Negative for rash.  Neurological:  Negative for dizziness and headaches.  Psychiatric/Behavioral:  Negative for depression and suicidal ideas.   All other systems reviewed and are negative.       Medical History: Past Medical History Past Medical  History: Diagnosis Date  Anemia    Anxiety        Problem List Patient Active Problem List Diagnosis  Biliary acute pancreatitis (HHS-HCC)      Past Surgical History Past Surgical History: Procedure Laterality Date  Arm hardware removal (Right)      Mass excision (Left)      Osteotomy and ulnar shortening (Right)          Allergies Allergies Allergen Reactions  Gluten Other (See Comments)  Oxycodone  Other (See Comments)      Medications Ordered Prior to Encounter Current Outpatient Medications on File Prior to Visit Medication Sig Dispense Refill  drospirenone, contraceptive, (SLYND) 4 mg (28) Tab 1 tablet Orally Once a day for 84 days      hydrOXYzine  (ATARAX ) 50 MG tablet TAKE 1 TABLET BY MOUTH ONCE A DAY IF NEEDED for 30 days      ondansetron  (ZOFRAN -ODT) 4 MG disintegrating tablet TAKE 1 TABLET BY MOUTH EVERY 8 HOURS AS NEEDED FOR NAUSEA AND VOMITING Oral      sertraline  (ZOLOFT ) 50 MG tablet TAKE 1 TABLET BY MOUTH EVERY DAY FOR 90 DAYS for 90        No current facility-administered medications on file prior to visit.      Family History Family History Problem Relation Age of Onset  Asthma Mother    High blood pressure (Hypertension) Father        Tobacco Use History Social History    Tobacco Use Smoking Status Former  Types: Cigarettes Smokeless Tobacco Never      Social History Social History    Socioeconomic History  Marital status: Single Tobacco Use  Smoking status: Former     Types: Cigarettes  Smokeless tobacco: Never Vaping Use  Vaping status: Never Used Substance and Sexual Activity  Alcohol use: Not Currently  Drug use: Never    Social Drivers of Health    Housing Stability: Unknown (07/24/2023)   Housing Stability Vital Sign    Homeless in the Last Year: No      Objective:     Vitals:   07/24/23 1128 BP: 116/72 Pulse: (!) 123 Temp: 36.6 C (97.8 F) SpO2: 98% Weight: 89.2 kg (196 lb 9.6 oz) Height: 157.5  cm (5\' 2" ) PainSc:   2 PainLoc: Abdomen   Body mass index is 35.96 kg/m.   Physical Exam Constitutional:      Appearance: Normal appearance.  HENT:     Head: Normocephalic and atraumatic.     Mouth/Throat:     Mouth: Mucous membranes are moist.     Pharynx: Oropharynx is clear.  Eyes:     General: No scleral icterus.    Pupils: Pupils are equal, round, and reactive to light.  Cardiovascular:     Rate and Rhythm: Normal rate and regular rhythm.     Pulses: Normal pulses.     Heart sounds: No murmur heard.    No friction rub. No gallop.  Pulmonary:     Effort: Pulmonary effort is normal. No respiratory distress.     Breath sounds: Normal breath sounds. No stridor.  Abdominal:     General: Abdomen is flat.  Musculoskeletal:        General: No swelling.  Skin:    General: Skin is warm.  Neurological:     General: No focal deficit present.     Mental Status: She is alert and oriented to person, place, and time. Mental status is at baseline.  Psychiatric:        Mood and Affect: Mood normal.        Thought Content: Thought content normal.        Judgment: Judgment normal.        Assessment and Plan: Diagnoses and all orders for this visit:   Symptomatic cholelithiasis     Chloe Rice is a 28 y.o. female    1.  We will proceed to the OR for a lap cholecystectomy. 2. All risks and benefits were discussed with the patient to generally include: infection, bleeding, possible need for post op ERCP, damage to the bile ducts, and bile leak. Alternatives were offered and described.  All questions were answered and the patient voiced understanding of the procedure and wishes to proceed at this point with a laparoscopic cholecystectomy           No follow-ups on file.   Shela Derby, MD, [facility], [place] [facility]

## 2023-08-02 ENCOUNTER — Observation Stay (HOSPITAL_COMMUNITY)
Admission: EM | Admit: 2023-08-02 | Discharge: 2023-08-05 | Disposition: A | Discharge status: Home / Self Care | Attending: Nurse Practitioner | Admitting: Nurse Practitioner

## 2023-08-02 ENCOUNTER — Other Ambulatory Visit: Payer: Self-pay

## 2023-08-02 ENCOUNTER — Emergency Department (HOSPITAL_COMMUNITY)

## 2023-08-02 DIAGNOSIS — R1011 Right upper quadrant pain: Principal | ICD-10-CM | POA: Diagnosis present

## 2023-08-02 DIAGNOSIS — K8012 Calculus of gallbladder with acute and chronic cholecystitis without obstruction: Principal | ICD-10-CM | POA: Insufficient documentation

## 2023-08-02 DIAGNOSIS — R7401 Elevation of levels of liver transaminase levels: Secondary | ICD-10-CM | POA: Diagnosis not present

## 2023-08-02 DIAGNOSIS — K831 Obstruction of bile duct: Secondary | ICD-10-CM | POA: Diagnosis present

## 2023-08-02 DIAGNOSIS — K8020 Calculus of gallbladder without cholecystitis without obstruction: Secondary | ICD-10-CM

## 2023-08-02 LAB — HCG, SERUM, QUALITATIVE: Preg, Serum: NEGATIVE

## 2023-08-02 LAB — COMPREHENSIVE METABOLIC PANEL WITH GFR
ALT: 512 U/L — ABNORMAL HIGH (ref 0–44)
AST: 421 U/L — ABNORMAL HIGH (ref 15–41)
Albumin: 3.7 g/dL (ref 3.5–5.0)
Alkaline Phosphatase: 111 U/L (ref 38–126)
Anion gap: 5 (ref 5–15)
BUN: 8 mg/dL (ref 6–20)
CO2: 27 mmol/L (ref 22–32)
Calcium: 9.2 mg/dL (ref 8.9–10.3)
Chloride: 110 mmol/L (ref 98–111)
Creatinine, Ser: 0.7 mg/dL (ref 0.44–1.00)
GFR, Estimated: >60 mL/min (ref >60)
Glucose, Bld: 125 mg/dL — ABNORMAL HIGH (ref 70–99)
Potassium: 4.3 mmol/L (ref 3.5–5.1)
Sodium: 142 mmol/L (ref 135–145)
Total Bilirubin: 2 mg/dL — ABNORMAL HIGH (ref 0.0–1.2)
Total Protein: 7 g/dL (ref 6.5–8.1)

## 2023-08-02 LAB — LIPASE, BLOOD: Lipase: 49 U/L (ref 11–51)

## 2023-08-02 LAB — CBC
HCT: 42.9 % (ref 36.0–46.0)
Hemoglobin: 13.4 g/dL (ref 12.0–15.0)
MCH: 28.6 pg (ref 26.0–34.0)
MCHC: 31.2 g/dL (ref 30.0–36.0)
MCV: 91.5 fL (ref 80.0–100.0)
Platelets: 193 10*3/uL (ref 150–400)
RBC: 4.69 MIL/uL (ref 3.87–5.11)
RDW: 14.9 % (ref 11.5–15.5)
WBC: 4.7 10*3/uL (ref 4.0–10.5)
nRBC: 0 % (ref 0.0–0.2)

## 2023-08-02 MED ORDER — ONDANSETRON HCL 4 MG/2ML IJ SOLN
4.0000 mg | Freq: Once | INTRAMUSCULAR | Status: AC
Start: 2023-08-02 — End: 2023-08-02
  Administered 2023-08-02: 4 mg via INTRAVENOUS
  Filled 2023-08-02: qty 2

## 2023-08-02 MED ORDER — MORPHINE SULFATE (PF) 4 MG/ML IV SOLN
4.0000 mg | Freq: Once | INTRAVENOUS | Status: AC
  Administered 2023-08-02: 4 mg via INTRAVENOUS
  Filled 2023-08-02: qty 1

## 2023-08-02 NOTE — ED Triage Notes (Signed)
 Patient c/o abdominal pain x 2 days. Patient report taking PRN pain medication without relief. Patient c/o nausea / vomitting x 2 today. Hx of pancreatitis.

## 2023-08-03 ENCOUNTER — Encounter (HOSPITAL_COMMUNITY): Payer: Self-pay | Admitting: Internal Medicine

## 2023-08-03 ENCOUNTER — Observation Stay (HOSPITAL_COMMUNITY)

## 2023-08-03 DIAGNOSIS — R1011 Right upper quadrant pain: Secondary | ICD-10-CM | POA: Diagnosis not present

## 2023-08-03 DIAGNOSIS — K831 Obstruction of bile duct: Secondary | ICD-10-CM | POA: Diagnosis present

## 2023-08-03 LAB — COMPREHENSIVE METABOLIC PANEL WITH GFR
ALT: 420 U/L — ABNORMAL HIGH (ref 0–44)
AST: 283 U/L — ABNORMAL HIGH (ref 15–41)
Albumin: 3.1 g/dL — ABNORMAL LOW (ref 3.5–5.0)
Alkaline Phosphatase: 93 U/L (ref 38–126)
Anion gap: 9 (ref 5–15)
BUN: 6 mg/dL (ref 6–20)
CO2: 25 mmol/L (ref 22–32)
Calcium: 8.8 mg/dL — ABNORMAL LOW (ref 8.9–10.3)
Chloride: 106 mmol/L (ref 98–111)
Creatinine, Ser: 0.44 mg/dL (ref 0.44–1.00)
GFR, Estimated: >60 mL/min
Glucose, Bld: 83 mg/dL (ref 70–99)
Potassium: 3.7 mmol/L (ref 3.5–5.1)
Sodium: 140 mmol/L (ref 135–145)
Total Bilirubin: 1.1 mg/dL (ref 0.0–1.2)
Total Protein: 5.8 g/dL — ABNORMAL LOW (ref 6.5–8.1)

## 2023-08-03 LAB — CBC WITH DIFFERENTIAL/PLATELET
Abs Immature Granulocytes: 0.01 K/uL (ref 0.00–0.07)
Basophils Absolute: 0 K/uL (ref 0.0–0.1)
Basophils Relative: 1 %
Eosinophils Absolute: 0.1 K/uL (ref 0.0–0.5)
Eosinophils Relative: 2 %
HCT: 36.5 % (ref 36.0–46.0)
Hemoglobin: 11.4 g/dL — ABNORMAL LOW (ref 12.0–15.0)
Immature Granulocytes: 0 %
Lymphocytes Relative: 51 %
Lymphs Abs: 2 K/uL (ref 0.7–4.0)
MCH: 29.4 pg (ref 26.0–34.0)
MCHC: 31.2 g/dL (ref 30.0–36.0)
MCV: 94.1 fL (ref 80.0–100.0)
Monocytes Absolute: 0.3 K/uL (ref 0.1–1.0)
Monocytes Relative: 7 %
Neutro Abs: 1.5 K/uL — ABNORMAL LOW (ref 1.7–7.7)
Neutrophils Relative %: 39 %
Platelets: 131 K/uL — ABNORMAL LOW (ref 150–400)
RBC: 3.88 MIL/uL (ref 3.87–5.11)
RDW: 15.2 % (ref 11.5–15.5)
WBC: 3.9 K/uL — ABNORMAL LOW (ref 4.0–10.5)
nRBC: 0 % (ref 0.0–0.2)

## 2023-08-03 LAB — URINALYSIS, ROUTINE W REFLEX MICROSCOPIC
Bacteria, UA: NONE SEEN
Bilirubin Urine: NEGATIVE
Glucose, UA: NEGATIVE mg/dL
Ketones, ur: NEGATIVE mg/dL
Leukocytes,Ua: NEGATIVE
Nitrite: NEGATIVE
Protein, ur: NEGATIVE mg/dL
RBC / HPF: >50 RBC/hpf (ref 0–5)
Specific Gravity, Urine: 1.012 (ref 1.005–1.030)
pH: 5 (ref 5.0–8.0)

## 2023-08-03 LAB — PROTIME-INR
INR: 1.1 (ref 0.8–1.2)
Prothrombin Time: 13.9 s (ref 11.4–15.2)

## 2023-08-03 LAB — BILIRUBIN, DIRECT: Bilirubin, Direct: 0.4 mg/dL — ABNORMAL HIGH (ref 0.0–0.2)

## 2023-08-03 LAB — MAGNESIUM: Magnesium: 2.1 mg/dL (ref 1.7–2.4)

## 2023-08-03 LAB — LIPASE, BLOOD: Lipase: 35 U/L (ref 11–51)

## 2023-08-03 MED ORDER — ACETAMINOPHEN 325 MG PO TABS
650.0000 mg | ORAL_TABLET | Freq: Four times a day (QID) | ORAL | Status: DC | PRN
  Administered 2023-08-03: 650 mg via ORAL
  Filled 2023-08-03: qty 2

## 2023-08-03 MED ORDER — SODIUM CHLORIDE 0.9% FLUSH
3.0000 mL | Freq: Two times a day (BID) | INTRAVENOUS | Status: DC
  Administered 2023-08-03 – 2023-08-04 (×3): 3 mL via INTRAVENOUS

## 2023-08-03 MED ORDER — LACTATED RINGERS IV BOLUS
1000.0000 mL | Freq: Once | INTRAVENOUS | Status: AC
  Administered 2023-08-03: 1000 mL via INTRAVENOUS

## 2023-08-03 MED ORDER — NALOXONE HCL 0.4 MG/ML IJ SOLN: 0.4000 mg | INTRAMUSCULAR | Status: DC | PRN

## 2023-08-03 MED ORDER — CHLORHEXIDINE GLUCONATE CLOTH 2 % EX PADS: 6.0000 | MEDICATED_PAD | Freq: Once | CUTANEOUS | Status: DC

## 2023-08-03 MED ORDER — SERTRALINE HCL 50 MG PO TABS
50.0000 mg | ORAL_TABLET | Freq: Once | ORAL | Status: AC
  Administered 2023-08-03: 50 mg via ORAL
  Filled 2023-08-03: qty 1

## 2023-08-03 MED ORDER — HYDROMORPHONE HCL 1 MG/ML IJ SOLN
0.5000 mg | Freq: Once | INTRAMUSCULAR | Status: AC
  Administered 2023-08-03: 0.5 mg via INTRAVENOUS
  Filled 2023-08-03: qty 1

## 2023-08-03 MED ORDER — HYDROMORPHONE HCL 1 MG/ML IJ SOLN
0.5000 mg | INTRAMUSCULAR | Status: DC | PRN
  Administered 2023-08-03 – 2023-08-04 (×8): 0.5 mg via INTRAVENOUS
  Filled 2023-08-03 (×5): qty 0.5
  Filled 2023-08-03: qty 1
  Filled 2023-08-03: qty 0.5

## 2023-08-03 MED ORDER — SERTRALINE HCL 50 MG PO TABS
50.0000 mg | ORAL_TABLET | Freq: Every day | ORAL | Status: DC
  Administered 2023-08-03 – 2023-08-04 (×2): 50 mg via ORAL
  Filled 2023-08-03 (×2): qty 1

## 2023-08-03 MED ORDER — ONDANSETRON HCL 4 MG/2ML IJ SOLN
4.0000 mg | Freq: Four times a day (QID) | INTRAMUSCULAR | Status: DC | PRN
  Administered 2023-08-03 – 2023-08-04 (×3): 4 mg via INTRAVENOUS
  Filled 2023-08-03 (×3): qty 2

## 2023-08-03 MED ORDER — CEFTRIAXONE SODIUM 2 G IJ SOLR
2.0000 g | INTRAMUSCULAR | Status: DC
  Administered 2023-08-03 – 2023-08-05 (×2): 2 g via INTRAVENOUS
  Filled 2023-08-03 (×2): qty 20

## 2023-08-03 MED ORDER — ACETAMINOPHEN 650 MG RE SUPP: 650.0000 mg | Freq: Four times a day (QID) | RECTAL | Status: DC | PRN

## 2023-08-03 MED ORDER — ONDANSETRON HCL 4 MG/2ML IJ SOLN
4.0000 mg | Freq: Once | INTRAMUSCULAR | Status: AC
  Administered 2023-08-03: 4 mg via INTRAVENOUS
  Filled 2023-08-03: qty 2

## 2023-08-03 MED ORDER — LACTATED RINGERS IV SOLN: INTRAVENOUS | Status: AC

## 2023-08-03 MED ORDER — HYDROXYZINE HCL 25 MG PO TABS
50.0000 mg | ORAL_TABLET | Freq: Every evening | ORAL | Status: DC | PRN
  Administered 2023-08-04: 50 mg via ORAL
  Filled 2023-08-03: qty 2

## 2023-08-03 NOTE — Consult Note (Signed)
 [facility] Consult  Referring Provider: ER/surgical team Primary Care Physician:  [facility] [place] Primary Gastroenterologist: Cherene Core primary  Reason for Consultation: Gallstones, elevated LFTs  HPI: Chloe Rice is a 28 y.o. female was in her usual state of health until 2 days ago when she developed abdominal pain located in epigastric and right upper quadrant radiating to her back associated with nausea and vomiting. She did not have any fever, denies chills or rigors.  Patient had gallstone pancreatitis in 06/2023 and was planned to have an outpatient cholecystectomy.  Currently on scheduled for cholecystectomy in 09/22/2023.  CT angio chest/abdomen/pelvis 07/03/2023: Acute edematous pancreatitis, thickened duodenum adjacent to pancreas could be duodenitis/secondary edema, no evidence of biliary obstruction or stone Outpatient labs with the goal 07/14/2023: Unremarkable CMP with normal LFTs  Past Medical History:  Diagnosis Date   Celiac disease since age 38   Heart murmur    as an infant, mother states she was "cleared" at age 29 mos.   Seizure [facility]) age 58   x 1; unknown cause, was not on anticonvulsant   Seizures (HCC) age 54 mos.   febrile x 2   Superficial swelling of scalp 06/2012   benign nodular swelling left occipital scalp    Past Surgical History:  Procedure Laterality Date   ARM HARDWARE REMOVAL Right 2013   MASS EXCISION Left 06/14/2012   Procedure: EXCISION OF BENIGN OCCIPTAL SCALP NODULE;  Surgeon: Melven Stable. Alanda Allegra, MD;  Location: [facility];  Service: Pediatrics;  Laterality: Left;   OSTEOTOMY AND ULNAR SHORTENING Right 2012    Prior to Admission medications   Medication Sig Start Date End Date Taking? Authorizing Provider  acetaminophen  (TYLENOL ) 325 MG tablet Take 2 tablets (650 mg total) by mouth every 6 (six) hours as needed for mild pain (pain score 1-3) (or Fever >/= 101). 07/12/23  Yes Faith Homes, MD   HYDROcodone -acetaminophen  (NORCO/VICODIN) 5-325 MG tablet Take 1 tablet by mouth every 4 (four) hours as needed for moderate pain (pain score 4-6). 07/12/23  Yes Faith Homes, MD  hydrOXYzine  (ATARAX ) 50 MG tablet Take 25-50 mg by mouth daily as needed (for sleep or panic).   Yes [provider]  ondansetron  (ZOFRAN -ODT) 4 MG disintegrating tablet Take 1 tablet (4 mg total) by mouth every 8 (eight) hours as needed for nausea or vomiting. 07/12/23  Yes Faith Homes, MD  sertraline  (ZOLOFT ) 50 MG tablet Take 50 mg by mouth daily.   Yes [provider]  SLYND 4 MG TABS Take 4 mg by mouth daily.   Yes [provider]  medroxyPROGESTERone  (DEPO-PROVERA ) 150 MG/ML injection INJECT 1 ML INTO THE MUSCLE AS DIRECTED Patient not taking: Reported on 07/05/2023 04/01/20 07/05/23  Ronna Coho, MD  pantoprazole  (PROTONIX ) 40 MG tablet Take 1 tablet (40 mg total) by mouth daily. Patient not taking: Reported on 08/03/2023 07/03/23   Alissa April, MD    Current Facility-Administered Medications  Medication Dose Route Frequency Provider Last Rate Last Admin   acetaminophen  (TYLENOL ) tablet 650 mg  650 mg Oral Q6H PRN Howerter, Justin B, DO   650 mg at 08/03/23 0446   Or   acetaminophen  (TYLENOL ) suppository 650 mg  650 mg Rectal Q6H PRN Howerter, Justin B, DO       HYDROmorphone  (DILAUDID ) injection 0.5 mg  0.5 mg Intravenous Q2H PRN Howerter, Justin B, DO   0.5 mg at 08/03/23 1610   lactated ringers  infusion   Intravenous Continuous Howerter, Justin B, DO  125 mL/hr at 08/03/23 0304 New Bag at 08/03/23 0304   naloxone  (NARCAN ) injection 0.4 mg  0.4 mg Intravenous PRN Howerter, Justin B, DO       ondansetron  (ZOFRAN ) injection 4 mg  4 mg Intravenous Q6H PRN Howerter, Justin B, DO       Current Outpatient Medications  Medication Sig Dispense Refill   acetaminophen  (TYLENOL ) 325 MG tablet Take 2 tablets (650 mg total) by mouth every 6 (six) hours as needed for mild pain (pain score 1-3) (or  Fever >/= 101).     HYDROcodone -acetaminophen  (NORCO/VICODIN) 5-325 MG tablet Take 1 tablet by mouth every 4 (four) hours as needed for moderate pain (pain score 4-6). 30 tablet 0   hydrOXYzine  (ATARAX ) 50 MG tablet Take 25-50 mg by mouth daily as needed (for sleep or panic).     ondansetron  (ZOFRAN -ODT) 4 MG disintegrating tablet Take 1 tablet (4 mg total) by mouth every 8 (eight) hours as needed for nausea or vomiting. 20 tablet 0   sertraline  (ZOLOFT ) 50 MG tablet Take 50 mg by mouth daily.     SLYND 4 MG TABS Take 4 mg by mouth daily.     medroxyPROGESTERone  (DEPO-PROVERA ) 150 MG/ML injection INJECT 1 ML INTO THE MUSCLE AS DIRECTED (Patient not taking: Reported on 07/05/2023) 1 mL 1   pantoprazole  (PROTONIX ) 40 MG tablet Take 1 tablet (40 mg total) by mouth daily. (Patient not taking: Reported on 08/03/2023) 30 tablet 0    Allergies as of 08/02/2023 - Review Complete 08/02/2023  Allergen Reaction Noted   Gluten meal Other (See Comments) 06/11/2012   Oxycodone  Anxiety and Other (See Comments) 03/16/2014    Family History  Problem Relation Age of Onset   Heart disease Maternal Grandmother        MI   Kidney disease Maternal Grandmother        was on dialysis   Stroke Paternal Grandmother    Heart disease Paternal Grandfather    Asthma Mother    Hypertension Father        treating with diet and exercise   Diabetes Maternal Uncle        juvenile    Social History   Socioeconomic History   Marital status: Married    Spouse name: Not on file   Number of children: Not on file   Years of education: Not on file   Highest education level: Not on file  Occupational History   Not on file  Tobacco Use   Smoking status: Never   Smokeless tobacco: Never  Vaping Use   Vaping status: Every Day  Substance and Sexual Activity   Alcohol use: Yes    Comment: Social   Drug use: No   Sexual activity: Not on file  Other Topics Concern   Not on file  Social History Narrative   Not on  file   Social Drivers of Health   Financial Resource Strain: Not on file  Food Insecurity: No Food Insecurity (07/06/2023)   Hunger Vital Sign    Worried About Running Out of Food in the Last Year: Never true    Ran Out of Food in the Last Year: Never true  Transportation Needs: No Transportation Needs (07/06/2023)   PRAPARE - Administrator, Civil Service (Medical): No    Lack of Transportation (Non-Medical): No  Physical Activity: Not on file  Stress: Not on file  Social Connections: Unknown (08/17/2021)   Received from [facility], [facility]  Social Network    Social Network: Not on file  Intimate Partner Violence: Not At Risk (07/06/2023)   Humiliation, Afraid, Rape, and Kick questionnaire    Fear of Current or Ex-Partner: No    Emotionally Abused: No    Physically Abused: No    Sexually Abused: No    Review of Systems: As per HPI  Physical Exam: Vital signs in last 24 hours: Temp:  [98 F (36.7 C)-98.7 F (37.1 C)] 98 F (36.7 C) (05/01 0528) Pulse Rate:  [63-96] 69 (05/01 0528) Resp:  [14-18] 17 (05/01 0528) BP: (98-106)/(61-78) 98/61 (05/01 0528) SpO2:  [98 %-100 %] 100 % (05/01 0528) Weight:  [87.7 kg] 87.7 kg (04/30 2041)    General:   Alert,  Well-developed, well-nourished, pleasant and cooperative in NAD Head:  Normocephalic and atraumatic. Eyes:  Sclera clear, no icterus.   Conjunctiva pink. Ears:  Normal auditory acuity. Nose:  No deformity, discharge,  or lesions. Mouth:  No deformity or lesions.  Oropharynx pink & moist. Neck:  Supple; no masses or thyromegaly. Lungs:  Clear throughout to auscultation.   No wheezes, crackles, or rhonchi. No acute distress. Heart:  Regular rate and rhythm; no murmurs, clicks, rubs,  or gallops. Extremities:  Without clubbing or edema. Neurologic:  Alert and  oriented x4;  grossly normal neurologically. Skin:  Intact without significant lesions or rashes. Psych:  Alert and cooperative. Normal mood and  affect. Abdomen: Mild epigastric discomfort        Lab Results: Recent Labs    08/02/23 2047  WBC 4.7  HGB 13.4  HCT 42.9  PLT 193   BMET Recent Labs    08/02/23 2047 08/03/23 0600  NA 142 140  K 4.3 3.7  CL 110 106  CO2 27 25  GLUCOSE 125* 83  BUN 8 6  CREATININE 0.70 0.44  CALCIUM 9.2 8.8*   LFT Recent Labs    08/03/23 0600  PROT 5.8*  ALBUMIN 3.1*  AST 283*  ALT 420*  ALKPHOS 93  BILITOT 1.1  BILIDIR 0.4*   PT/INR Recent Labs    08/03/23 0600  LABPROT 13.9  INR 1.1    Studies/Results: US  Abdomen Limited RUQ (LIVER/GB) Result Date: 08/03/2023 EXAM: Right Upper Quadrant Abdominal Ultrasound 08/03/2023 12:04:42 AM TECHNIQUE: Real-time ultrasonography of the right upper quadrant of the abdomen was performed. COMPARISON: None available. CLINICAL HISTORY: RUQ pain FINDINGS: LIVER: Hyperechoic hepatic parenchyma, suggesting hepatic steatosis. No evidence of intrahepatic biliary ductal dilatation. No evidence of mass. BILIARY SYSTEM: No pericholecystic fluid or gallbladder distention. Negative sonographic Murphy's sign. Common bile duct measures 4 mm. Cholelithiasis, measuring up to 13 mm. Associated mildly irregular gallbladder wall thickening. RIGHT KIDNEY: The right kidney is grossly unremarkable without evidence of hydronephrosis. PANCREAS: Visualized portions of the pancreas are unremarkable. OTHER: No evidence of right upper quadrant ascites. IMPRESSION: 1. Cholelithiasis, without convincing sonographic findings to suggest acute cholecystitis. 2. Hepatic steatosis. Electronically signed by: Zadie Herter MD 08/03/2023 12:09 AM EDT RP Workstation: [id number]    Impression: Symptomatic cholelithiasis, AST 283, ALT 420  Normal CBD of 4 mm Normal T. bili 1.1 with normal alk phos of 93  History of celiac disease for the past 17 years, on gluten-free diet History of pancreatitis, normal lipase on this admission  Plan: Being planned for cholecystectomy  tomorrow. Since CBD appears unremarkable, no role for ERCP. Please recall GI if needed.   LOS: 0 days   Genell Ken, MD  08/03/2023, 7:56 AM

## 2023-08-03 NOTE — H&P (Signed)
 History and Physical    Patient: Chloe Rice ZOX:[id number] DOB: 1995/12/22 DOA: 08/02/2023 DOS: the patient was seen and examined on 08/03/2023 PCP: [facility] [place]  Patient coming from: Home  Chief Complaint:  Chief Complaint  Patient presents with   Abdominal Pain   HPI: Chloe Rice is a 28 y.o. female with medical history significant of celiac disease.  Patient recently discharged on 4/9 after an admission for acute gallstone pancreatitis complicated by pneumonia.  The past 2 days she has developed recurrent right upper quadrant abdominal discomfort.  Labs reviewed elevated AST and ALTE with a total bilirubin of 2.0 with normal lipase.  Right upper quadrant ultrasound revealed cholelithiasis without sonographic evidence of acute cholecystitis.  No signs on ultrasound that would be concerning for choledocholithiasis.  The ER physician has discussed with on-call surgeon Dr. Alethea Andes who will consult.  The ED physician has also contacted [facility] Dr. Lavaughn Portland requesting consultation.  Hospital service has been asked to evaluate the patient for admission.   Review of Systems: Patient reports 1 episode of emesis prior to arrival.  She continues to have pain but it is improved with use of IV pain medications.  No other pertinent review of systems  Past Medical History:  Diagnosis Date   Celiac disease since age 31   Heart murmur    as an infant, mother states she was "cleared" at age 29 mos.   Seizure [facility]) age 81   x 1; unknown cause, was not on anticonvulsant   Seizures (HCC) age 32 mos.   febrile x 2   Superficial swelling of scalp 06/2012   benign nodular swelling left occipital scalp   Past Surgical History:  Procedure Laterality Date   ARM HARDWARE REMOVAL Right 2013   MASS EXCISION Left 06/14/2012   Procedure: EXCISION OF BENIGN OCCIPTAL SCALP NODULE;  Surgeon: Melven Stable. Alanda Allegra, MD;  Location: [facility];  Service: Pediatrics;  Laterality: Left;    OSTEOTOMY AND ULNAR SHORTENING Right 2012   Social History:  reports that she has never smoked. She has never used smokeless tobacco. She reports current alcohol use. She reports that she does not use drugs.  Allergies  Allergen Reactions   Gluten Meal Other (See Comments)    CELIAC DISEASE and Abdominal Pain   Oxycodone  Anxiety and Other (See Comments)    Uncontrollable shaking (advised not to consume) and muscle spasms, also    Family History  Problem Relation Age of Onset   Heart disease Maternal Grandmother        MI   Kidney disease Maternal Grandmother        was on dialysis   Stroke Paternal Grandmother    Heart disease Paternal Grandfather    Asthma Mother    Hypertension Father        treating with diet and exercise   Diabetes Maternal Uncle        juvenile    Prior to Admission medications   Medication Sig Start Date End Date Taking? Authorizing Provider  acetaminophen  (TYLENOL ) 325 MG tablet Take 2 tablets (650 mg total) by mouth every 6 (six) hours as needed for mild pain (pain score 1-3) (or Fever >/= 101). 07/12/23  Yes Faith Homes, MD  HYDROcodone -acetaminophen  (NORCO/VICODIN) 5-325 MG tablet Take 1 tablet by mouth every 4 (four) hours as needed for moderate pain (pain score 4-6). 07/12/23  Yes Faith Homes, MD  hydrOXYzine  (ATARAX ) 50 MG tablet Take 25-50 mg by mouth daily  as needed (for sleep or panic).   Yes [provider]  ondansetron  (ZOFRAN -ODT) 4 MG disintegrating tablet Take 1 tablet (4 mg total) by mouth every 8 (eight) hours as needed for nausea or vomiting. 07/12/23  Yes Faith Homes, MD  sertraline  (ZOLOFT ) 50 MG tablet Take 50 mg by mouth daily.   Yes [provider]  SLYND 4 MG TABS Take 4 mg by mouth daily.   Yes [provider]  medroxyPROGESTERone  (DEPO-PROVERA ) 150 MG/ML injection INJECT 1 ML INTO THE MUSCLE AS DIRECTED Patient not taking: Reported on 07/05/2023 04/01/20 07/05/23  Ronna Coho, MD  pantoprazole  (PROTONIX )  40 MG tablet Take 1 tablet (40 mg total) by mouth daily. Patient not taking: Reported on 08/03/2023 07/03/23   Alissa April, MD    Physical Exam: Vitals:   08/03/23 0030 08/03/23 0139 08/03/23 0339 08/03/23 0528  BP: 99/66 104/67  98/61  Pulse: 72 71  69  Resp:  14  17  Temp:   98.7 F (37.1 C) 98 F (36.7 C)  TempSrc:    Oral  SpO2: 98% 99%  100%  Weight:      Height:       Constitutional: NAD, calm, comfortable Respiratory: clear to auscultation bilaterally, no wheezing, no crackles. Normal respiratory effort. No accessory muscle use.  Cardiovascular: Regular rate and rhythm, no murmurs / rubs / gallops. No extremity edema. 2+ pedal pulses.  Distal extremities warm to touch with adequate capillary refill Abdomen: Mild tenderness guarding or rebounding in the epigastric and right upper quadrant regions, no masses palpated. No hepatosplenomegaly. Bowel sounds positive.  Musculoskeletal: no clubbing / cyanosis. No joint deformity upper and lower extremities. Good ROM, no contractures. Normal muscle tone.  Skin: no rashes, lesions, ulcers. No induration Neurologic: CN 2-12 grossly intact. Sensation intact, Strength 5/5 x all 4 extremities.  Psychiatric: Normal judgment and insight. Alert and oriented x 3. Normal mood.    Data Reviewed:  Labs at presentation: Sodium 142, potassium 4.3, glucose 125, BUN 8, creatinine 0.7, alkaline phosphatase normal, AST 421, ALT 512, total bilirubin 2  WBC 4700 differential not obtained, hemoglobin 13.4, platelets 193,000  Urinalysis unremarkable except for large amount of ketones  Abdominal ultrasound as documented  Labs 5/1: Sodium 140, potassium 3.7, chloride 106, CO2 25, glucose 83, BUN 6, creatinine 0.44, magnesium  2.1, albumin 3.1, lipase remains normal at 35, AST 283, ALT 420, total bilirubin 1.1  Coags normal  Assessment and Plan: Acute biliary obstruction in patient with known cholelithiasis and recent gallstone pancreatitis Await  recommendations of surgical and gastroenterology teams Anticipate possible cholecystectomy procedure this admission.  Unclear if given presentation patient may require ERCP-SCDs until timing of any invasive procedures clarified Repeat labs including lipase N.p.o. and continue IV fluids No leukocytosis so no suspicions of ascending cholangitis Continue pain management with IV narcotics/IV Dilaudid   Celiac disease Appropriate diet once allowed oral intake   Advance Care Planning:   Code Status: Full Code   VTE prophylaxis: SCDs-can transition to Lovenox  once timing of potential surgery clarified by the surgical team.  Also need to clarify if any procedures pending per GI team  Consults: Neurosurgery, gastroenterology  Family Communication: Patient only  Severity of Illness: The appropriate patient status for this patient is OBSERVATION. Observation status is judged to be reasonable and necessary in order to provide the required intensity of service to ensure the patient's safety. The patient's presenting symptoms, physical exam findings, and initial radiographic and laboratory data in the context of their  medical condition is felt to place them at decreased risk for further clinical deterioration. Furthermore, it is anticipated that the patient will be medically stable for discharge from the hospital within 2 midnights of admission.   Author: Kathye Parkin, NP 08/03/2023 6:23 AM  For on call review [email or web address].

## 2023-08-03 NOTE — TOC Initial Note (Signed)
 Transition of Care [name]) - Initial/Assessment Note    Patient Details  Name: Chloe Rice MRN: [id number] Date of Birth: 17-Jun-1995   Transition of Care [name]) CM/SW Contact:    [name], RN Phone Number: 08/03/2023, 12:06 PM   Clinical Narrative: PTA lives with spouse [name] [phone]) in a house;independent; drives to appointments;PCP and insurance verified; No DME, HH oxygen or SDOH needs. Pt spouse will transport home at discharge. No TOC needs identified. TOC sign off   Transition of Care Asessment: Insurance and Status: Insurance coverage has been reviewed Patient has primary care physician: Yes Home environment has been reviewed: House with spouse Prior level of function:: Independent Prior/Current Home Services: No current home services Social Drivers of Health Review: SDOH reviewed no interventions necessary Readmission risk has been reviewed: Yes Transition of care needs: no transition of care needs at this time         Patient Goals and CMS Choice            Expected Discharge Plan and Services                                              Prior Living Arrangements/Services                       Activities of Daily Living   ADL Screening (condition at time of admission) Independently performs ADLs?: Yes (appropriate for developmental age) Is the patient deaf or have difficulty hearing?: No Does the patient have difficulty seeing, even when wearing glasses/contacts?: No Does the patient have difficulty concentrating, remembering, or making decisions?: No  Permission Sought/Granted                  Emotional Assessment              Admission diagnosis:  Biliary tract obstruction [K83.1] Right upper quadrant pain [R10.11] RUQ pain [R10.11] Transaminitis [R74.01] Calculus of gallbladder without cholecystitis without obstruction [K80.20] Patient Active Problem List   Diagnosis Date Noted   RUQ pain 08/03/2023    Biliary tract obstruction 08/03/2023   Gallstone pancreatitis 07/06/2023   Acute gallstone pancreatitis 07/05/2023   PCP:  [facility]:   [phone] [place],  - [phone] [place] [address] Phone: [phone] Fax: [phone]     Social Drivers of Health (SDOH) Social History: SDOH Screenings   Food Insecurity: No Food Insecurity (08/03/2023)  Housing: Low Risk  (08/03/2023)  Transportation Needs: No Transportation Needs (08/03/2023)  Utilities: Not At Risk (08/03/2023)  Social Connections: Unknown (08/17/2021)   Received from [facility]  Tobacco Use: Low Risk  (07/05/2023)   SDOH Interventions:     Readmission Risk Interventions     No data to display

## 2023-08-03 NOTE — ED Provider Notes (Signed)
 [place] EMERGENCY DEPARTMENT AT [facility] Provider Note   CSN: [id number] Arrival date & time: 08/02/23  2036     History  Chief Complaint  Patient presents with   Abdominal Pain    Chloe Rice is a 28 y.o. female.  Patient with history of gallstone pancreatitis, celiac disease presents today with complaints of right upper quadrant abdominal pain, nausea, and vomiting.  States that her symptoms began 2 days ago and got worse today.  States her pain feels similar to when she was seen and diagnosed with gallstone pancreatitis previously.  She is concerned for same.  Denies any fevers or chills.  Notes that it was just a few weeks ago when she was admitted for gallstone pancreatitis and initial plan was for cholecystectomy, however patient also had pneumonia and was hypoxic and therefore surgical team did not feel like she was stable for surgery.  Her abdominal pain, labs, and pneumonia improved and she was discharged home with plan for an outpatient cholecystectomy in June.  She has been doing well since discharge up until 2 days ago.  The history is provided by the patient. No language interpreter was used.  Abdominal Pain Associated symptoms: nausea and vomiting        Home Medications Prior to Admission medications   Medication Sig Start Date End Date Taking? Authorizing Provider  acetaminophen  (TYLENOL ) 325 MG tablet Take 2 tablets (650 mg total) by mouth every 6 (six) hours as needed for mild pain (pain score 1-3) (or Fever >/= 101). 07/12/23   Faith Homes, MD  HYDROcodone -acetaminophen  (NORCO/VICODIN) 5-325 MG tablet Take 1 tablet by mouth every 4 (four) hours as needed for moderate pain (pain score 4-6). 07/12/23   Faith Homes, MD  hydrOXYzine  (ATARAX ) 50 MG tablet Take 25-50 mg by mouth daily as needed (for sleep or panic).    [provider]  medroxyPROGESTERone  (DEPO-PROVERA ) 150 MG/ML injection INJECT 1 ML INTO THE MUSCLE AS DIRECTED Patient not  taking: Reported on 07/05/2023 04/01/20 07/05/23  Ronna Coho, MD  ondansetron  (ZOFRAN -ODT) 4 MG disintegrating tablet Take 1 tablet (4 mg total) by mouth every 8 (eight) hours as needed for nausea or vomiting. 07/12/23   Faith Homes, MD  pantoprazole  (PROTONIX ) 40 MG tablet Take 1 tablet (40 mg total) by mouth daily. 07/03/23   Alissa April, MD  sertraline  (ZOLOFT ) 50 MG tablet Take 50 mg by mouth daily.    [provider]  SLYND 4 MG TABS Take 4 mg by mouth daily.    [provider]      Allergies    Gluten meal and Oxycodone     Review of Systems   Review of Systems  Gastrointestinal:  Positive for abdominal pain, nausea and vomiting.  All other systems reviewed and are negative.   Physical Exam Updated Vital Signs BP 106/71 (BP Location: Left Arm)   Pulse 66   Temp 98.6 F (37 C) (Oral)   Resp 16   Ht 5\' 2"  (1.575 m)   Wt 87.7 kg   LMP 04/19/2023 (Approximate)   SpO2 100%   BMI 35.36 kg/m  Physical Exam Vitals and nursing note reviewed.  Constitutional:      General: She is not in acute distress.    Appearance: Normal appearance. She is normal weight. She is not ill-appearing, toxic-appearing or diaphoretic.  HENT:     Head: Normocephalic and atraumatic.  Cardiovascular:     Rate and Rhythm: Normal rate.  Pulmonary:  Effort: Pulmonary effort is normal. No respiratory distress.  Abdominal:     General: Abdomen is flat.     Palpations: Abdomen is soft.     Tenderness: There is abdominal tenderness in the right upper quadrant.  Musculoskeletal:        General: Normal range of motion.     Cervical back: Normal range of motion.  Skin:    General: Skin is warm and dry.  Neurological:     General: No focal deficit present.     Mental Status: She is alert.  Psychiatric:        Mood and Affect: Mood normal.        Behavior: Behavior normal.     ED Results / Procedures / Treatments   Labs (all labs ordered are listed, but only abnormal results  are displayed) Labs Reviewed  COMPREHENSIVE METABOLIC PANEL WITH GFR - Abnormal; Notable for the following components:      Result Value   Glucose, Bld 125 (*)    AST 421 (*)    ALT 512 (*)    Total Bilirubin 2.0 (*)    All other components within normal limits  LIPASE, BLOOD  CBC  HCG, SERUM, QUALITATIVE  URINALYSIS, ROUTINE W REFLEX MICROSCOPIC    EKG None  Radiology US  Abdomen Limited RUQ (LIVER/GB) Result Date: 08/03/2023 EXAM: Right Upper Quadrant Abdominal Ultrasound 08/03/2023 12:04:42 AM TECHNIQUE: Real-time ultrasonography of the right upper quadrant of the abdomen was performed. COMPARISON: None available. CLINICAL HISTORY: RUQ pain FINDINGS: LIVER: Hyperechoic hepatic parenchyma, suggesting hepatic steatosis. No evidence of intrahepatic biliary ductal dilatation. No evidence of mass. BILIARY SYSTEM: No pericholecystic fluid or gallbladder distention. Negative sonographic Murphy's sign. Common bile duct measures 4 mm. Cholelithiasis, measuring up to 13 mm. Associated mildly irregular gallbladder wall thickening. RIGHT KIDNEY: The right kidney is grossly unremarkable without evidence of hydronephrosis. PANCREAS: Visualized portions of the pancreas are unremarkable. OTHER: No evidence of right upper quadrant ascites. IMPRESSION: 1. Cholelithiasis, without convincing sonographic findings to suggest acute cholecystitis. 2. Hepatic steatosis. Electronically signed by: Zadie Herter MD 08/03/2023 12:09 AM EDT RP Workstation: [id number]    Procedures Procedures    Medications Ordered in ED Medications  morphine  (PF) 4 MG/ML injection 4 mg (4 mg Intravenous Given 08/02/23 2303)  ondansetron  (ZOFRAN ) injection 4 mg (4 mg Intravenous Given 08/02/23 2303)    ED Course/ Medical Decision Making/ A&P                                 Medical Decision Making Amount and/or Complexity of Data Reviewed Labs: ordered. Radiology: ordered.  Risk Prescription drug management.   This  patient is a 28 y.o. female who presents to the ED for concern of RUQ abdominal pain, this involves an extensive number of treatment options, and is a complaint that carries with it a high risk of complications and morbidity. The emergent differential diagnosis prior to evaluation includes, but is not limited to,  PUD, gastritis, pancreatitis, gastroparesis, malignancy, biliary disease, ACS, pericarditis, pneumonia, intestinal ischemia, esophageal rupture, hepatitis, pregnancy    This is not an exhaustive differential.   Past Medical History / Co-morbidities / Social History:  has a past medical history of Celiac disease (since age 84), Heart murmur, Seizure (HCC) (age 55), Seizures (HCC) (age 7 mos.), and Superficial swelling of scalp (06/2012).  Additional history: Chart reviewed. Pertinent results include: Patient admitted for gallstone pancreatitis on 4/2, however also  was hypoxic and had pneumonia at that time and therefore did not have cholecystectomy during admission.  Her symptoms got better and she went home.  Plan for outpatient cholecystectomy at a later date.  Physical Exam: Physical exam performed. The pertinent findings include: RUQ TTP  Lab Tests: I ordered, and personally interpreted labs.  The pertinent results include:  AST 421, ALT 512, t bili 2.0.  No leukocytosis.   Imaging Studies: I ordered imaging studies including RUQ US . I independently visualized and interpreted imaging which showed   1. Cholelithiasis, without convincing sonographic findings to suggest acute cholecystitis. 2. Hepatic steatosis.  I agree with the radiologist interpretation.   Medications: I ordered medication including morphine , zofran , dilaudid   for pain, nausea, dehydration.  Zoloft  as patient request her home medicine. Reevaluation of the patient after these medicines showed that the patient improved. I have reviewed the patients home medicines and have made adjustments as  needed.  Consultations Obtained: I requested consultation with the general surgery Dr. Alethea Andes, GI Dr. Lavaughn Portland,  and discussed lab and imaging findings as well as pertinent plan - they recommend: Dr Alethea Andes requests medicine admission, NPO, GI consult as well. Messaged Dr. Lavaughn Portland.    Disposition: After consideration of the diagnostic results and the patients response to treatment, I feel that patient will require admission for continued RUQ pain, elevated liver enzymes, likely inpatient cholecystectomy.  Discussed same with patient is understanding and in agreement with this.  Discussed patient with hospitalist Dr. Brock Canner who accepts patient for admission.  I discussed this case with my attending physician Dr. Carylon Claude who cosigned this note including patient's presenting symptoms, physical exam, and planned diagnostics and interventions. Attending physician stated agreement with plan or made changes to plan which were implemented.    Final Clinical Impression(s) / ED Diagnoses Final diagnoses:  Right upper quadrant pain  Transaminitis  Calculus of gallbladder without cholecystitis without obstruction    Rx / DC Orders ED Discharge Orders     None         Sherra Dk, PA-C 08/03/23 0201    Rory Collard, MD 08/05/23 [id number]

## 2023-08-03 NOTE — Plan of Care (Signed)

## 2023-08-03 NOTE — Progress Notes (Signed)
  Carryover admission to the Day Admitter.  I discussed this case with the EDP, Sarah, Smoot, PA.  Per these discussions:   This is a 28 year old female who was recently hospitalized with acute gallstone pancreatitis, who is now being admitted with worsening right upper quadrant discomfort associate with interval increase in liver enzymes.  The patient was hospitalized approximately 3 weeks ago for acute gallstone pancreatitis, with hospital course complicated by pneumonia.  Her acute gallstone pancreatitis was treated conservatively, and her abdominal pain reportedly improved.   However, over the last 2 days, she is developed recurrent right upper quadrant abdominal discomfort, prompting her to present to [facility] emergency department this evening.  Vital signs in the ED this evening were notable the following: afebrile.   Relative to most recent set liver enzymes on 07/12/2023 her CMP this evening reflects interval increase in AST, ALT, and total bilirubin with the latter now up to 2.0.  CBC reflects interval resolution of prior mild leukocytosis.  Lipase is 49 compared to 4800 at time of admission 1 month ago.  Right upper quadrant ultrasound shows cholelithiasis, without sonographic evidence of acute cholecystitis.  This imaging also shows no dilation of the common bile duct and no overt choledocholithiasis.  EDP has d/w on-call general surgery, Dr. Alethea Andes, who will consult; additionally, EDP has contacted on-call [facility], Dr. Lavaughn Portland, requesting consult.    I have placed an order for observation to med/tele for further evaluation management of the above.  I have placed some additional preliminary admit orders via the adult multi-morbid admission order set. I have also ordered prn IV Dilaudid , prn IV Zofran , 1 L LR bolus followed by continuous LR at 125 cc/h.  Also ordered morning labs in the form of CMP, CBC, magnesium  level, direct bilirubin, and INR.  I have kept the patient  NPO.    Camelia Cavalier, DO Hospitalist

## 2023-08-03 NOTE — Consult Note (Addendum)
 [facility] Progress Note     Subjective: CC:   Known to our service after recent admission 07/05/2023 for gallstone pancreatitis. Hospitalization was complicated by pneumonia with hypoxia so her cholecystectomy was delayed. She was discharged home and tells me she is scheduled with Dr. Melton Squires for cholecystectomy 09/22/23. Two days ago she had recurrent severe epigastric and RUQ pain with radiation to her back. Assocated with nausea and vomiting. Symptoms are not adequately controlled with Vicodin and zofran . She works as a Chartered certified accountant shift.   Objective: Vital signs in last 24 hours: Temp:  [98 F (36.7 C)-98.7 F (37.1 C)] 98.1 F (36.7 C) (05/01 0802) Pulse Rate:  [63-96] 67 (05/01 0802) Resp:  [14-18] 16 (05/01 0802) BP: (98-114)/(61-78) 114/73 (05/01 0802) SpO2:  [96 %-100 %] 96 % (05/01 0802) Weight:  [87.7 kg] 87.7 kg (04/30 2041)    Intake/Output from previous day: No intake/output data recorded. Intake/Output this shift: No intake/output data recorded.  PE: Gen:  Alert, NAD, pleasant Card:  Regular rate and rhythm Pulm:  Normal effort ORA, sats 97% Abd: Soft, TTP RUQ without guarding, no distention, no peritonitis, no palpable hernias or masses Skin: warm and dry, no rashes  Psych: A&Ox3   Lab Results:  Recent Labs    08/02/23 2047  WBC 4.7  HGB 13.4  HCT 42.9  PLT 193   BMET Recent Labs    08/02/23 2047 08/03/23 0600  NA 142 140  K 4.3 3.7  CL 110 106  CO2 27 25  GLUCOSE 125* 83  BUN 8 6  CREATININE 0.70 0.44  CALCIUM 9.2 8.8*   PT/INR Recent Labs    08/03/23 0600  LABPROT 13.9  INR 1.1   CMP     Component Value Date/Time   NA 140 08/03/2023 0600   K 3.7 08/03/2023 0600   CL 106 08/03/2023 0600   CO2 25 08/03/2023 0600   GLUCOSE 83 08/03/2023 0600   BUN 6 08/03/2023 0600   CREATININE 0.44 08/03/2023 0600   CALCIUM 8.8 (L) 08/03/2023 0600   PROT 5.8 (L) 08/03/2023 0600   ALBUMIN 3.1 (L) 08/03/2023 0600   AST  283 (H) 08/03/2023 0600   ALT 420 (H) 08/03/2023 0600   ALKPHOS 93 08/03/2023 0600   BILITOT 1.1 08/03/2023 0600   GFRNONAA >60 08/03/2023 0600   GFRAA NOT CALCULATED 03/28/2013 0210   Lipase     Component Value Date/Time   LIPASE 35 08/03/2023 0600       Studies/Results: DG CHEST PORT 1 VIEW Result Date: 08/03/2023 CLINICAL DATA:  History of recent pneumonia. EXAM: PORTABLE CHEST 1 VIEW COMPARISON:  07/09/2023 FINDINGS: Low volume film. The cardiopericardial silhouette is within normal limits for size. Airspace disease seen previously at the right base has resolved in the interval. Similar improvement noted in the left base with some trace residual curvilinear density today, likely atelectatic. No pleural effusion. No acute bony abnormality. IMPRESSION: Trace atelectasis at the left base. Otherwise no acute cardiopulmonary findings. Electronically Signed   By: Donnal Fusi M.D.   On: 08/03/2023 07:58   US  Abdomen Limited RUQ (LIVER/GB) Result Date: 08/03/2023 EXAM: Right Upper Quadrant Abdominal Ultrasound 08/03/2023 12:04:42 AM TECHNIQUE: Real-time ultrasonography of the right upper quadrant of the abdomen was performed. COMPARISON: None available. CLINICAL HISTORY: RUQ pain FINDINGS: LIVER: Hyperechoic hepatic parenchyma, suggesting hepatic steatosis. No evidence of intrahepatic biliary ductal dilatation. No evidence of mass. BILIARY SYSTEM: No pericholecystic fluid or gallbladder distention. Negative sonographic Murphy's sign. Common  bile duct measures 4 mm. Cholelithiasis, measuring up to 13 mm. Associated mildly irregular gallbladder wall thickening. RIGHT KIDNEY: The right kidney is grossly unremarkable without evidence of hydronephrosis. PANCREAS: Visualized portions of the pancreas are unremarkable. OTHER: No evidence of right upper quadrant ascites. IMPRESSION: 1. Cholelithiasis, without convincing sonographic findings to suggest acute cholecystitis. 2. Hepatic steatosis. Electronically  signed by: Zadie Herter MD 08/03/2023 12:09 AM EDT RP Workstation: [id number]    Anti-infectives: Anti-infectives (From admission, onward)    Start     Dose/Rate Route Frequency Ordered Stop   08/03/23 0915  cefTRIAXone  (ROCEPHIN ) 2 g in sodium chloride  0.9 % 100 mL IVPB       Note to Pharmacy: Pharmacy may adjust dosing strength / duration / interval for maximal efficacy   2 g 200 mL/hr over 30 Minutes Intravenous Every 24 hours 08/03/23 0829           Latest Ref Rng & Units 08/03/2023    6:00 AM 08/02/2023    8:47 PM 07/12/2023    4:36 AM  Hepatic Function  Total Protein 6.5 - 8.1 g/dL 5.8  7.0  6.1   Albumin 3.5 - 5.0 g/dL 3.1  3.7  2.3   AST 15 - 41 U/L 283  421  18   ALT 0 - 44 U/L 420  512  37   Alk Phosphatase 38 - 126 U/L 93  111  79   Total Bilirubin 0.0 - 1.2 mg/dL 1.1  2.0  0.8   Bilirubin, Direct 0.0 - 0.2 mg/dL 0.4        Assessment/Plan Symptomatic cholelithiasis, suspect cholecystitis 28 y/o F with recent admission for gallstone pancreatitis now back in hospital with recurrent abdominal pain and vomiting.   - afebrile, VSS, WBC yesterday 4.7 - LFTs as above bilia was 2.0 yesterday, 1.1 today. Possible she passed anotehr gallstone. Lipase is WNL - recommend admission for cholecystectomy. CLD today. NPO after MN. Start Rocephin  empirically for cholecystitis  - given recent PNA I ordered a CXR which shows interval improvement, mild atelectasis.    LOS: 0 days   I reviewed nursing notes, ED provider notes, hospitalist notes, last 24 h vitals and pain scores, last 48 h intake and output, last 24 h labs and trends, and last 24 h imaging results.  This care required high  level of medical decision making.   Michial Akin, PA-C [facility] Please see Amion for pager number during day hours 7:00am-4:30pm

## 2023-08-04 ENCOUNTER — Observation Stay (HOSPITAL_COMMUNITY): Admitting: Anesthesiology

## 2023-08-04 ENCOUNTER — Observation Stay (HOSPITAL_BASED_OUTPATIENT_CLINIC_OR_DEPARTMENT_OTHER): Admitting: Anesthesiology

## 2023-08-04 ENCOUNTER — Other Ambulatory Visit: Payer: Self-pay

## 2023-08-04 ENCOUNTER — Encounter (HOSPITAL_COMMUNITY)
Admission: EM | Disposition: A | Discharge status: Home / Self Care | Payer: Self-pay | Source: Home / Self Care | Attending: Emergency Medicine

## 2023-08-04 DIAGNOSIS — K8000 Calculus of gallbladder with acute cholecystitis without obstruction: Secondary | ICD-10-CM

## 2023-08-04 DIAGNOSIS — R1011 Right upper quadrant pain: Secondary | ICD-10-CM | POA: Diagnosis not present

## 2023-08-04 HISTORY — PX: CHOLECYSTECTOMY: SHX55

## 2023-08-04 LAB — COMPREHENSIVE METABOLIC PANEL WITH GFR
ALT: 308 U/L — ABNORMAL HIGH (ref 0–44)
AST: 116 U/L — ABNORMAL HIGH (ref 15–41)
Albumin: 3.3 g/dL — ABNORMAL LOW (ref 3.5–5.0)
Alkaline Phosphatase: 95 U/L (ref 38–126)
Anion gap: 9 (ref 5–15)
BUN: 5 mg/dL — ABNORMAL LOW (ref 6–20)
CO2: 26 mmol/L (ref 22–32)
Calcium: 9.4 mg/dL (ref 8.9–10.3)
Chloride: 108 mmol/L (ref 98–111)
Creatinine, Ser: 0.65 mg/dL (ref 0.44–1.00)
GFR, Estimated: >60 mL/min (ref >60)
Glucose, Bld: 80 mg/dL (ref 70–99)
Potassium: 4.4 mmol/L (ref 3.5–5.1)
Sodium: 143 mmol/L (ref 135–145)
Total Bilirubin: 0.8 mg/dL (ref 0.0–1.2)
Total Protein: 6.3 g/dL — ABNORMAL LOW (ref 6.5–8.1)

## 2023-08-04 SURGERY — LAPAROSCOPIC CHOLECYSTECTOMY WITH INTRAOPERATIVE CHOLANGIOGRAM
Anesthesia: General

## 2023-08-04 MED ORDER — 0.9 % SODIUM CHLORIDE (POUR BTL) OPTIME
TOPICAL | Status: DC | PRN
  Administered 2023-08-04: 1000 mL

## 2023-08-04 MED ORDER — OXYCODONE HCL 5 MG/5ML PO SOLN: 5.0000 mg | Freq: Once | ORAL | Status: DC | PRN

## 2023-08-04 MED ORDER — DEXMEDETOMIDINE HCL IN NACL 80 MCG/20ML IV SOLN
INTRAVENOUS | Status: DC | PRN
  Administered 2023-08-04: 8 ug via INTRAVENOUS

## 2023-08-04 MED ORDER — OXYCODONE HCL 5 MG PO TABS: 5.0000 mg | ORAL_TABLET | Freq: Once | ORAL | Status: DC | PRN

## 2023-08-04 MED ORDER — ONDANSETRON HCL 4 MG/2ML IJ SOLN
INTRAMUSCULAR | Status: AC
  Filled 2023-08-04: qty 2

## 2023-08-04 MED ORDER — CEFAZOLIN SODIUM-DEXTROSE 2-4 GM/100ML-% IV SOLN
2.0000 g | INTRAVENOUS | Status: AC
  Administered 2023-08-04: 2 g via INTRAVENOUS
  Filled 2023-08-04: qty 100

## 2023-08-04 MED ORDER — PROPOFOL 10 MG/ML IV BOLUS
INTRAVENOUS | Status: DC | PRN
  Administered 2023-08-04: 150 mg via INTRAVENOUS

## 2023-08-04 MED ORDER — ACETAMINOPHEN 500 MG PO TABS
ORAL_TABLET | ORAL | Status: AC
  Filled 2023-08-04: qty 2

## 2023-08-04 MED ORDER — ONDANSETRON HCL 4 MG/2ML IJ SOLN: 4.0000 mg | Freq: Once | INTRAMUSCULAR | Status: DC | PRN

## 2023-08-04 MED ORDER — ONDANSETRON HCL 4 MG/2ML IJ SOLN
INTRAMUSCULAR | Status: DC | PRN
  Administered 2023-08-04: 4 mg via INTRAVENOUS

## 2023-08-04 MED ORDER — BUPIVACAINE HCL (PF) 0.5 % IJ SOLN
INTRAMUSCULAR | Status: AC
  Filled 2023-08-04: qty 30

## 2023-08-04 MED ORDER — HYDROMORPHONE HCL 1 MG/ML IJ SOLN
1.0000 mg | INTRAMUSCULAR | Status: DC | PRN
  Administered 2023-08-04 – 2023-08-05 (×4): 1 mg via INTRAVENOUS
  Filled 2023-08-04 (×6): qty 1

## 2023-08-04 MED ORDER — HYDROMORPHONE HCL 1 MG/ML IJ SOLN
INTRAMUSCULAR | Status: AC
  Administered 2023-08-04: 1 mg via INTRAVENOUS
  Filled 2023-08-04: qty 1

## 2023-08-04 MED ORDER — TRAMADOL HCL 50 MG PO TABS
50.0000 mg | ORAL_TABLET | Freq: Four times a day (QID) | ORAL | Status: DC | PRN
Start: 2023-08-04 — End: 2023-08-05
  Administered 2023-08-04: 50 mg via ORAL
  Administered 2023-08-05 (×2): 100 mg via ORAL
  Filled 2023-08-04 (×2): qty 2
  Filled 2023-08-04: qty 1

## 2023-08-04 MED ORDER — MIDAZOLAM HCL 2 MG/2ML IJ SOLN
INTRAMUSCULAR | Status: AC
  Filled 2023-08-04: qty 2

## 2023-08-04 MED ORDER — ENSURE PRE-SURGERY PO LIQD: 296.0000 mL | Freq: Once | ORAL | Status: DC

## 2023-08-04 MED ORDER — DEXAMETHASONE SODIUM PHOSPHATE 10 MG/ML IJ SOLN
INTRAMUSCULAR | Status: AC
  Filled 2023-08-04: qty 1

## 2023-08-04 MED ORDER — LIDOCAINE HCL (CARDIAC) PF 100 MG/5ML IV SOSY
PREFILLED_SYRINGE | INTRAVENOUS | Status: DC | PRN
  Administered 2023-08-04: 100 mg via INTRATRACHEAL

## 2023-08-04 MED ORDER — PROPOFOL 10 MG/ML IV BOLUS
INTRAVENOUS | Status: AC
  Filled 2023-08-04: qty 20

## 2023-08-04 MED ORDER — SUGAMMADEX SODIUM 200 MG/2ML IV SOLN
INTRAVENOUS | Status: DC | PRN
Start: 2023-08-04 — End: 2023-08-04
  Administered 2023-08-04: 200 mg via INTRAVENOUS

## 2023-08-04 MED ORDER — FENTANYL CITRATE (PF) 100 MCG/2ML IJ SOLN
INTRAMUSCULAR | Status: DC | PRN
  Administered 2023-08-04: 50 ug via INTRAVENOUS

## 2023-08-04 MED ORDER — LACTATED RINGERS IV SOLN: INTRAVENOUS | Status: DC | PRN

## 2023-08-04 MED ORDER — MIDAZOLAM HCL 2 MG/2ML IJ SOLN
INTRAMUSCULAR | Status: DC | PRN
  Administered 2023-08-04: 2 mg via INTRAVENOUS

## 2023-08-04 MED ORDER — OXYCODONE HCL 5 MG PO TABS
ORAL_TABLET | ORAL | Status: AC
  Filled 2023-08-04: qty 1

## 2023-08-04 MED ORDER — INDOCYANINE GREEN 25 MG IV SOLR
2.5000 mg | Freq: Once | INTRAVENOUS | Status: AC
  Administered 2023-08-04: 2.5 mg via INTRAVENOUS

## 2023-08-04 MED ORDER — ROCURONIUM BROMIDE 10 MG/ML (PF) SYRINGE
PREFILLED_SYRINGE | INTRAVENOUS | Status: AC
  Filled 2023-08-04: qty 10

## 2023-08-04 MED ORDER — LACTATED RINGERS IR SOLN
Status: DC | PRN
  Administered 2023-08-04: 1000 mL

## 2023-08-04 MED ORDER — KETOROLAC TROMETHAMINE 30 MG/ML IJ SOLN
INTRAMUSCULAR | Status: AC
  Filled 2023-08-04: qty 1

## 2023-08-04 MED ORDER — BUPIVACAINE HCL (PF) 0.5 % IJ SOLN
INTRAMUSCULAR | Status: DC | PRN
  Administered 2023-08-04: 30 mL

## 2023-08-04 MED ORDER — KETOROLAC TROMETHAMINE 30 MG/ML IJ SOLN
30.0000 mg | Freq: Once | INTRAMUSCULAR | Status: AC | PRN
  Administered 2023-08-04: 30 mg via INTRAVENOUS

## 2023-08-04 MED ORDER — LIDOCAINE HCL (PF) 2 % IJ SOLN
INTRAMUSCULAR | Status: AC
  Filled 2023-08-04: qty 5

## 2023-08-04 MED ORDER — HYDROMORPHONE HCL 1 MG/ML IJ SOLN: 0.2500 mg | INTRAMUSCULAR | Status: DC | PRN

## 2023-08-04 MED ORDER — DEXMEDETOMIDINE HCL IN NACL 80 MCG/20ML IV SOLN
INTRAVENOUS | Status: AC
  Filled 2023-08-04: qty 20

## 2023-08-04 MED ORDER — ROCURONIUM BROMIDE 100 MG/10ML IV SOLN
INTRAVENOUS | Status: DC | PRN
  Administered 2023-08-04: 50 mg via INTRAVENOUS

## 2023-08-04 MED ORDER — FENTANYL CITRATE (PF) 100 MCG/2ML IJ SOLN
INTRAMUSCULAR | Status: AC
Start: 2023-08-04 — End: ?
  Filled 2023-08-04: qty 2

## 2023-08-04 MED ORDER — ACETAMINOPHEN 500 MG PO TABS
1000.0000 mg | ORAL_TABLET | ORAL | Status: AC
  Administered 2023-08-04: 1000 mg via ORAL
  Filled 2023-08-04: qty 2

## 2023-08-04 SURGICAL SUPPLY — 28 items
BAG COUNTER SPONGE SURGICOUNT (BAG) IMPLANT
CABLE HIGH FREQUENCY MONO STRZ (ELECTRODE) ×1 IMPLANT
CHLORAPREP W/TINT 26 (MISCELLANEOUS) ×1 IMPLANT
CLIP APPLIE 5 13 M/L LIGAMAX5 (MISCELLANEOUS) ×1 IMPLANT
COVER MAYO STAND XLG (MISCELLANEOUS) IMPLANT
COVER SURGICAL LIGHT HANDLE (MISCELLANEOUS) ×1 IMPLANT
DERMABOND ADVANCED .7 DNX12 (GAUZE/BANDAGES/DRESSINGS) ×1 IMPLANT
DRAPE C-ARM 42X120 X-RAY (DRAPES) IMPLANT
ELECT REM PT RETURN 15FT ADLT (MISCELLANEOUS) ×1 IMPLANT
GLOVE BIO SURGEON STRL SZ7.5 (GLOVE) ×1 IMPLANT
GOWN STRL REUS W/ TWL XL LVL3 (GOWN DISPOSABLE) ×1 IMPLANT
HEMOSTAT SURGICEL 4X8 (HEMOSTASIS) IMPLANT
IRRIGATION SUCT STRKRFLW 2 WTP (MISCELLANEOUS) ×1 IMPLANT
KIT BASIN OR (CUSTOM PROCEDURE TRAY) ×1 IMPLANT
KIT TURNOVER KIT A (KITS) IMPLANT
PENCIL SMOKE EVACUATOR (MISCELLANEOUS) IMPLANT
SCISSORS LAP 5X35 DISP (ENDOMECHANICALS) ×1 IMPLANT
SET CHOLANGIOGRAPH MIX (MISCELLANEOUS) IMPLANT
SET TUBE SMOKE EVAC HIGH FLOW (TUBING) ×1 IMPLANT
SLEEVE Z-THREAD 5X100MM (TROCAR) ×2 IMPLANT
SPIKE FLUID TRANSFER (MISCELLANEOUS) ×1 IMPLANT
SUT MNCRL AB 4-0 PS2 18 (SUTURE) ×1 IMPLANT
SUT VICRYL 0 UR6 27IN ABS (SUTURE) ×1 IMPLANT
SYSTEM BAG RETRIEVAL 10MM (BASKET) ×1 IMPLANT
TOWEL OR 17X26 10 PK STRL BLUE (TOWEL DISPOSABLE) ×1 IMPLANT
TRAY LAPAROSCOPIC (CUSTOM PROCEDURE TRAY) ×1 IMPLANT
TROCAR BALLN 12MMX100 BLUNT (TROCAR) ×1 IMPLANT
TROCAR Z-THREAD OPTICAL 5X100M (TROCAR) ×1 IMPLANT

## 2023-08-04 NOTE — Anesthesia Preprocedure Evaluation (Addendum)
 Anesthesia Evaluation  Patient identified by MRN, date of birth, ID band Patient awake    Reviewed: Allergy & Precautions, NPO status , Patient's Chart, lab work & pertinent test results  History of Anesthesia Complications Negative for: history of anesthetic complications  Airway Mallampati: II  TM Distance: >3 FB Neck ROM: Full    Dental no notable dental hx. (+) Teeth Intact, Dental Advisory Given   Pulmonary pneumonia, resolved   Pulmonary exam normal breath sounds clear to auscultation       Cardiovascular (-) hypertension(-) angina (-) Past MI negative cardio ROS Normal cardiovascular exam Rhythm:Regular Rate:Normal  07/2023 Echo 1. Left ventricular ejection fraction, by estimation, is 60 to 65%. The  left ventricle has normal function. The left ventricle has no regional  wall motion abnormalities. Left ventricular diastolic parameters were  normal.   2. Right ventricular systolic function is normal. The right ventricular  size is normal. Tricuspid regurgitation signal is inadequate for assessing  PA pressure.   3. The mitral valve is normal in structure. No evidence of mitral valve  regurgitation. No evidence of mitral stenosis.   4. The aortic valve is tricuspid. Aortic valve regurgitation is not  visualized. No aortic stenosis is present.   5. The inferior vena cava is normal in size with greater than 50%  respiratory variability, suggesting right atrial pressure of 3 mmHg.   6. There is a left pleural effusion. Trivial pericardial effusion.     Neuro/Psych  negative psych ROS   GI/Hepatic Neg liver ROS,,,Celiac DZ   Endo/Other  negative endocrine ROS    Renal/GU Lab Results      Component                Value               Date                        K                        4.4                 08/04/2023                CO2                      26                  08/04/2023                BUN                       5 (L)               08/04/2023                CREATININE               0.65                08/04/2023                GFRNONAA                 >60                 08/04/2023  ALBUMIN                  3.3 (L)             08/04/2023                GLUCOSE                  80                  08/04/2023                Musculoskeletal negative musculoskeletal ROS (+)    Abdominal   Peds  Hematology Lab Results      Component                Value               Date                      WBC                      3.9 (L)             08/03/2023                HGB                      11.4 (L)            08/03/2023                HCT                      36.5                08/03/2023                MCV                      94.1                08/03/2023                PLT                      131 (L)             08/03/2023              Anesthesia Other Findings All: Oxycodone   Reproductive/Obstetrics negative OB ROS                             Anesthesia Physical Anesthesia Plan  ASA: 2  Anesthesia Plan: General   Post-op Pain Management: Tylenol  PO (pre-op)*, Toradol  IV (intra-op)*, Precedex  and Dilaudid  IV   Induction: Intravenous  PONV Risk Score and Plan: 4 or greater and Treatment may vary due to age or medical condition, Ondansetron , Midazolam  and Dexamethasone   Airway Management Planned: Oral ETT  Additional Equipment: None  Intra-op Plan:   Post-operative Plan: Extubation in OR  Informed Consent: I have reviewed the patients History and Physical, chart, labs and discussed the procedure including the risks, benefits and alternatives for the proposed anesthesia with the patient or authorized representative who has indicated his/her understanding and acceptance.     Dental advisory given  Plan Discussed with: CRNA and Surgeon  Anesthesia Plan Comments:        Anesthesia Quick Evaluation

## 2023-08-04 NOTE — Progress Notes (Signed)
 PROGRESS NOTE  Chloe Rice  DOB: 1995-04-22  PCP: [facility] ZOX:[id number]  DOA: 08/02/2023  LOS: 0 days  Hospital Day: 3  Brief narrative: Chloe Rice is a 28 y.o. female with PMH significant for celiac disease was recently hospitalized for acute gallstone pancreatitis and was complicated by pneumonia.  She was discharged with the plan of laparoscopic cholecystectomy as an outpatient. 4/30, patient presented to the ED with complaint of recurrence of right upper quadrant pain.. Workup showed elevated LFTs, normal lipase Right upper quadrant ultrasound showed cholelithiasis without evidence of cholecystitis  Admitted to [facility] Seen by GI and general surgery Did not need any endoscopy intervention 5/2, underwent lap chole with intraoperative cholangiogram by Dr. Lucienne Ryder   Subjective: Patient was seen and examined this afternoon after surgery Drowsy from the effect of sedation. Not in distress. Family at bedside  Assessment and plan: Symptomatic cholelithiasis  Suspect acute cholecystitis  S/p lap chole with IOC-Dr. Lucienne Ryder 5/2 Continue to monitor pain control Clear liquid diet allowed by general surgery  Elevated LFTs  Bilirubin was initially elevated.  Spontaneously improved.  Did not need endoscopic intervention.   LFTs downtrending as below.  Continue to monitor Recent Labs  Lab 08/02/23 2047 08/03/23 0600 08/04/23 0400  AST 421* 283* 116*  ALT 512* 420* 308*  ALKPHOS 111 93 95  BILITOT 2.0* 1.1 0.8  PROT 7.0 5.8* 6.3*  ALBUMIN 3.7 3.1* 3.3*     Celiac disease Appropriate diet once allowed oral intake  Anxiety Continue trazodone, sertraline   Mobility: Encourage ambulation  Goals of care   Code Status: Full Code     DVT prophylaxis:  SCDs Start: 08/03/23 0759 Place and maintain sequential compression device Start: 08/03/23 8119 SCDs Start: 08/03/23 0200  Fluid: None Consultants: General Surgery, GI Family  Communication: Family at bedside  Status: Observation Level of care:  Med-Surg   Patient is from: Home Needs to continue in-hospital care: POD 0 Anticipated d/c to: Hopefully home tomorrow if LFTs improving    Diet:  Diet Order             Diet clear liquid Fluid consistency: Thin  Diet effective now                   Scheduled Meds:  HYDROmorphone        ketorolac        sertraline   50 mg Oral QHS   sodium chloride  flush  3 mL Intravenous Q12H    PRN meds: acetaminophen  **OR** acetaminophen , HYDROmorphone , HYDROmorphone  (DILAUDID ) injection, hydrOXYzine , ketorolac , naLOXone  (NARCAN )  injection, ondansetron  (ZOFRAN ) IV, traMADol    Infusions:   cefTRIAXone  (ROCEPHIN )  IV 2 g (08/03/23 1108)    Antimicrobials: Anti-infectives (From admission, onward)    Start     Dose/Rate Route Frequency Ordered Stop   08/04/23 0845  ceFAZolin  (ANCEF ) IVPB 2g/100 mL premix        2 g 200 mL/hr over 30 Minutes Intravenous On call to O.R. 08/04/23 1478 08/04/23 1018   08/03/23 0915  cefTRIAXone  (ROCEPHIN ) 2 g in sodium chloride  0.9 % 100 mL IVPB       Note to Pharmacy: Pharmacy may adjust dosing strength / duration / interval for maximal efficacy   2 g 200 mL/hr over 30 Minutes Intravenous Every 24 hours 08/03/23 0829         Objective: Vitals:   08/04/23 1238 08/04/23 1324  BP: 123/78   Pulse: 99   Resp: 16   Temp: 97.9 F (  36.6 C)   SpO2: (!) 84% 93%    Intake/Output Summary (Last 24 hours) at 08/04/2023 1537 Last data filed at 08/03/2023 2105 Gross per 24 hour  Intake 570 ml  Output --  Net 570 ml   Filed Weights   08/02/23 2041 08/04/23 0855  Weight: 87.7 kg 87.7 kg   Weight change:  Body mass index is 35.35 kg/m.   Physical Exam: General exam: Pleasant, young Caucasian female Skin: No rashes, lesions or ulcers. HEENT: Atraumatic, normocephalic, no obvious bleeding Lungs: Clear to auscultation bilaterally,  CVS: S1, S2, no murmur,   GI/Abd: Soft, mild  postop tenderness, nondistended. CNS: Somnolent from the effect of anesthesia Psychiatry: Mood appropriate,  Extremities: No pedal edema, no calf tenderness,   Data Review: I have personally reviewed the laboratory data and studies available.  F/u labs ordered Unresulted Labs (From admission, onward)    None       Signed, Hoyt Macleod, MD [facility] 08/04/2023

## 2023-08-04 NOTE — Op Note (Signed)
 LAPAROSCOPIC CHOLECYSTECTOMY WITH ICG DYE  Procedure Note  Chloe Rice 08/02/2023 - 08/04/2023   Pre-op Diagnosis: acute cholecystitis     Post-op Diagnosis: same  Procedure(s): LAPAROSCOPIC CHOLECYSTECTOMY WITH ICG DYE  Surgeon(s): Oza Blumenthal, MD  Anesthesia: General  Staff:  Circulator: Manalo, Maelin O, RN Scrub Person: Bobbette Burns  Estimated Blood Loss: Minimal               Specimens: sent to path  Indications: This is a 28 year old female who recently presented with gallstone pancreatitis.  She had pneumonia also so surgery was held on eventual cholecystectomy.  She improved and surgery is planned for June she had recurrent cholecystitis the decision was made to proceed to the operating room for a laparoscopic cholecystectomy  Findings: The patient was found to have a acutely inflamed gallbladder with gallstones.  Procedure: The patient was brought to the operating identified the correct patient.  She was placed upon on the operating table and general anesthesia was induced.  Her abdomen was prepped and draped in the usual sterile fashion.  I made a vertical incision below the umbilicus with a scalpel.  I carried this down to the fascia which was then opened the scalpel as well.  I then gained entrance to the peritoneal cavity under direct vision with a Kelly clamp.  A 0 Vicryl pursestring suture was placed around the fascial opening.  The Rutledge port was placed through the opening and insufflation of the abdomen was began.  I placed a 5 mm trocar in the patient's epigastrium and 2 more in the right upper quadrant all under direct vision.  The gallbladder was found to be distended and acutely inflamed.  I had aspirated bile from the gallbladder in order to grasp it further.  I was then able to grasp it and retracted above the liver bed.  The patient's falciform ligament was barely attached to the liver and going right down to near the base the gallbladder  so I took it down with surgical clips.  I was then able to dissect out the base of the gallbladder.  It was very friable and the cystic duct was fairly short.  An ICG cholangiogram was performed confirming the location of the bile duct.  I clipped the cystic duct 3 times proximally once distally and transected after achieving a critical window around it.  I then clipped the cystic artery 3 times proximally once distally as well.  The gallbladder was then slowly dissected free from the liver bed with electrocautery.  Once it was free from the liver bed it was placed in an Endosac and removed through the incision at the umbilicus.  We again evaluated the liver bed and hemostasis was achieved.  We irrigated the right upper quadrant thoroughly with normal saline.  I then removed the umbilical trocar and tied the suture down closing the fascial defect.  All ports were then removed under direct vision and the abdomen was deflated.  All incisions were then anesthetized Marcaine  and closed with 4-0 Monocryl sutures.  Dermabond was then applied.  The patient tolerated the procedure well.  All the counts were correct at the end of the procedure.  The patient was then extubated in the operating room and taken in a stable condition to the recovery room.          Oza Blumenthal   Date: 08/04/2023  Time: 11:08 AM

## 2023-08-04 NOTE — Plan of Care (Signed)
  Problem: Education: Goal: Knowledge of General Education information will improve Description: Including pain rating scale, medication(s)/side effects and non-pharmacologic comfort measures Outcome: Progressing   Problem: Clinical Measurements: Goal: Will remain free from infection Outcome: Progressing Goal: Respiratory complications will improve Outcome: Progressing   Problem: Nutrition: Goal: Adequate nutrition will be maintained Outcome: Progressing   Problem: Coping: Goal: Level of anxiety will decrease Outcome: Progressing   Problem: Elimination: Goal: Will not experience complications related to bowel motility Outcome: Progressing Goal: Will not experience complications related to urinary retention Outcome: Progressing   Problem: Pain Managment: Goal: General experience of comfort will improve and/or be controlled Outcome: Progressing   Problem: Safety: Goal: Ability to remain free from injury will improve Outcome: Progressing   Problem: Skin Integrity: Goal: Risk for impaired skin integrity will decrease Outcome: Progressing

## 2023-08-04 NOTE — Progress Notes (Signed)
 Patient ID: Chloe Rice, female   DOB: 1996-02-10, 28 y.o.   MRN: [id number] Pre Procedure note for inpatients:   Chloe Rice has been scheduled for Procedure(s): LAPAROSCOPIC CHOLECYSTECTOMY WITH INTRAOPERATIVE CHOLANGIOGRAM (N/A) today. The various methods of treatment have been discussed with the patient. After consideration of the risks, benefits and treatment options the patient has consented to the planned procedure.   The patient has been seen and labs reviewed. There are no changes in the patient's condition to prevent proceeding with the planned procedure today.  Recent labs:  Lab Results  Component Value Date   WBC 3.9 (L) 08/03/2023   HGB 11.4 (L) 08/03/2023   HCT 36.5 08/03/2023   PLT 131 (L) 08/03/2023   GLUCOSE 80 08/04/2023   ALT 308 (H) 08/04/2023   AST 116 (H) 08/04/2023   NA 143 08/04/2023   K 4.4 08/04/2023   CL 108 08/04/2023   CREATININE 0.65 08/04/2023   BUN 5 (L) 08/04/2023   CO2 26 08/04/2023   INR 1.1 08/03/2023    Oza Blumenthal, MD 08/04/2023 10:01 AM

## 2023-08-04 NOTE — Anesthesia Postprocedure Evaluation (Signed)
 Anesthesia Post Note  Patient: Chloe Rice  Procedure(s) Performed: LAPAROSCOPIC CHOLECYSTECTOMY WITH ICG DYE     Patient location during evaluation: PACU Anesthesia Type: General Level of consciousness: awake and alert Pain management: pain level controlled Vital Signs Assessment: post-procedure vital signs reviewed and stable Respiratory status: spontaneous breathing, nonlabored ventilation, respiratory function stable and patient connected to nasal cannula oxygen Cardiovascular status: blood pressure returned to baseline and stable Postop Assessment: no apparent nausea or vomiting Anesthetic complications: no  No notable events documented.  Last Vitals:  Vitals:   08/04/23 1215 08/04/23 1238  BP: 121/73 123/78  Pulse: 88 99  Resp: 19 16  Temp:  36.6 C  SpO2: 91% (!) 84%    Last Pain:  Vitals:   08/04/23 1218  TempSrc:   PainSc: 5                  Rosalita Combe

## 2023-08-04 NOTE — Transfer of Care (Signed)
 Immediate Anesthesia Transfer of Care Note  Patient: Chloe Rice  Procedure(s) Performed: LAPAROSCOPIC CHOLECYSTECTOMY WITH ICG DYE  Patient Location: PACU  Anesthesia Type:General  Level of Consciousness: awake, alert , and oriented  Airway & Oxygen Therapy: Patient Spontanous Breathing and Patient connected to nasal cannula oxygen  Post-op Assessment: Report given to RN and Post -op Vital signs reviewed and stable  Post vital signs: Reviewed and stable  Last Vitals:  Vitals Value Taken Time  BP 112/72 08/04/23 1121  Temp 36.8   Pulse 86 08/04/23 1123  Resp 22 08/04/23 1123  SpO2 98 % 08/04/23 1123  Vitals shown include unfiled device data.  Last Pain:  Vitals:   08/04/23 0945  TempSrc:   PainSc: 6          Complications: No notable events documented.

## 2023-08-04 NOTE — Anesthesia Procedure Notes (Signed)
 Procedure Name: Intubation Date/Time: 08/04/2023 10:16 AM  Performed by: Darlena Ego, CRNAPre-anesthesia Checklist: Patient identified, Emergency Drugs available, Suction available and Patient being monitored Patient Re-evaluated:Patient Re-evaluated prior to induction Oxygen Delivery Method: Circle System Utilized Preoxygenation: Pre-oxygenation with 100% oxygen Induction Type: IV induction Ventilation: Mask ventilation without difficulty Tube type: Oral Tube size: 7.0 mm Number of attempts: 1 Airway Equipment and Method: Stylet and Oral airway Placement Confirmation: ETT inserted through vocal cords under direct vision, positive ETCO2 and breath sounds checked- equal and bilateral Secured at: 21 cm Tube secured with: Tape Dental Injury: Teeth and Oropharynx as per pre-operative assessment

## 2023-08-04 NOTE — Discharge Instructions (Signed)

## 2023-08-05 ENCOUNTER — Encounter (HOSPITAL_COMMUNITY): Payer: Self-pay | Admitting: Surgery

## 2023-08-05 MED ORDER — TRAMADOL HCL 50 MG PO TABS: 50.0000 mg | ORAL_TABLET | Freq: Four times a day (QID) | ORAL | 0 refills | Status: DC | PRN

## 2023-08-05 NOTE — Discharge Summary (Signed)
 Physician Discharge Summary  Patient ID: Chloe Rice MRN: [id number] DOB/AGE: 09-06-1995 28 y.o.  Admit date: 08/02/2023 Discharge date: 08/05/2023  Admission Diagnoses:  Acute calculus cholecystitis  Discharge Diagnoses: Same Principal Problem:   RUQ pain Active Problems:   Biliary tract obstruction   Discharged Condition: good  Hospital Course: She is well-known to us  secondary to her recent admission for gallstone pancreatitis and pneumonia. She presents again with cholecystitis. Originally she was going to undergo a laparoscopic cholecystectomy in June but now we have decided to proceed tomorrow given her recurrence versus a percutaneous cholecystostomy tube.  08/04/23 - Dr. Lucienne Rice - Laparoscopic cholecystectomy with ICG dye for acute cholecystitis.  Patient is sore but feeling better this morning.  No nausea or vomiting.  Tolerating diet.      Treatments: surgery: laparoscopic cholecystectomy  Discharge Exam: Blood pressure 103/64, pulse 77, temperature 98.3 F (36.8 C), resp. rate 16, height 5\' 2"  (1.575 m), weight 87.7 kg, last menstrual period 04/19/2023, SpO2 97%. WDWN in NAD Abd - soft, incisional tenderness Incisions c/d/i  Disposition: Discharge disposition: 01-Home or Self Care       Discharge Instructions     Call MD for:  persistant nausea and vomiting   Complete by: As directed    Call MD for:  redness, tenderness, or signs of infection (pain, swelling, redness, odor or green/yellow discharge around incision site)   Complete by: As directed    Call MD for:  severe uncontrolled pain   Complete by: As directed    Call MD for:  temperature >100.4   Complete by: As directed    Diet general   Complete by: As directed    Driving Restrictions   Complete by: As directed    Do not drive while taking pain medications   Increase activity slowly   Complete by: As directed    May shower / Bathe   Complete by: As directed       Allergies as of 08/05/2023        Reactions   Gluten Meal Other (See Comments)   CELIAC DISEASE and Abdominal Pain   Oxycodone  Anxiety, Other (See Comments)   Uncontrollable shaking (advised not to consume) and muscle spasms, also        Medication List     TAKE these medications    acetaminophen  325 MG tablet Commonly known as: TYLENOL  Take 2 tablets (650 mg total) by mouth every 6 (six) hours as needed for mild pain (pain score 1-3) (or Fever >/= 101).   HYDROcodone -acetaminophen  5-325 MG tablet Commonly known as: NORCO/VICODIN Take 1 tablet by mouth every 4 (four) hours as needed for moderate pain (pain score 4-6).   hydrOXYzine  50 MG tablet Commonly known as: ATARAX  Take 25-50 mg by mouth daily as needed (for sleep or panic).   medroxyPROGESTERone  150 MG/ML injection Commonly known as: DEPO-PROVERA  INJECT 1 ML INTO THE MUSCLE AS DIRECTED   ondansetron  4 MG disintegrating tablet Commonly known as: ZOFRAN -ODT Take 1 tablet (4 mg total) by mouth every 8 (eight) hours as needed for nausea or vomiting.   pantoprazole  40 MG tablet Commonly known as: PROTONIX  Take 1 tablet (40 mg total) by mouth daily.   sertraline  50 MG tablet Commonly known as: ZOLOFT  Take 50 mg by mouth daily.   Slynd 4 MG Tabs Generic drug: Drospirenone Take 4 mg by mouth daily.   traMADol  50 MG tablet Commonly known as: ULTRAM  Take 1 tablet (50 mg total) by mouth every 6 (six) hours  as needed for moderate pain (pain score 4-6).        Follow-up Information     Rice, Chloe Gosai, PA-C Follow up.   Specialty: General Surgery Why: our office is scheduling you for post-operative follow up in 3-4 weeks. call to confirm appointment date/time. Contact information: [address] [place] [address] [phone]                 Signed: [name] 08/05/2023, 8:13 AM

## 2023-08-05 NOTE — Plan of Care (Signed)

## 2023-08-05 NOTE — Progress Notes (Signed)
 AVS reviewed w/ pt who verbalized an understanding- no other questions at this time. Pt eating lunch - PIV will be dc/d if diet tolerated- Ride here

## 2023-08-05 NOTE — Progress Notes (Signed)
 Briefly seen this morning POD1. Feels better. Eating her breakfast. Discharge summary and orders done by general surgeon this morning. No change from medical standpoint. [facility] will not bill for today.

## 2023-08-07 LAB — SURGICAL PATHOLOGY

## 2023-08-20 ENCOUNTER — Encounter: Payer: Self-pay | Admitting: General Surgery

## 2023-08-22 ENCOUNTER — Other Ambulatory Visit (HOSPITAL_BASED_OUTPATIENT_CLINIC_OR_DEPARTMENT_OTHER): Payer: Self-pay | Admitting: Student

## 2023-08-22 DIAGNOSIS — R1084 Generalized abdominal pain: Secondary | ICD-10-CM

## 2023-08-22 DIAGNOSIS — Z9049 Acquired absence of other specified parts of digestive tract: Secondary | ICD-10-CM

## 2023-08-22 DIAGNOSIS — R110 Nausea: Secondary | ICD-10-CM

## 2023-08-23 ENCOUNTER — Encounter (HOSPITAL_COMMUNITY): Payer: Self-pay

## 2023-08-23 ENCOUNTER — Ambulatory Visit (HOSPITAL_COMMUNITY)
Admission: RE | Admit: 2023-08-23 | Discharge: 2023-08-23 | Disposition: A | Discharge status: Home / Self Care | Source: Ambulatory Visit | Attending: Student | Admitting: Student

## 2023-08-23 DIAGNOSIS — R110 Nausea: Secondary | ICD-10-CM | POA: Insufficient documentation

## 2023-08-23 DIAGNOSIS — R1084 Generalized abdominal pain: Secondary | ICD-10-CM | POA: Diagnosis present

## 2023-08-23 DIAGNOSIS — Z9049 Acquired absence of other specified parts of digestive tract: Secondary | ICD-10-CM | POA: Diagnosis present

## 2023-08-23 MED ORDER — IOHEXOL 9 MG/ML PO SOLN
1000.0000 mL | ORAL | Status: AC
  Administered 2023-08-23 (×2): 500 mL via ORAL

## 2023-08-23 MED ORDER — IOHEXOL 9 MG/ML PO SOLN
ORAL | Status: AC
  Filled 2023-08-23: qty 1000

## 2023-08-23 MED ORDER — IOHEXOL 300 MG/ML  SOLN
100.0000 mL | Freq: Once | INTRAMUSCULAR | Status: AC | PRN
Start: 2023-08-23 — End: 2023-08-23
  Administered 2023-08-23: 100 mL via INTRAVENOUS

## 2023-08-23 MED ORDER — SODIUM CHLORIDE (PF) 0.9 % IJ SOLN
INTRAMUSCULAR | Status: AC
  Filled 2023-08-23: qty 50

## 2023-10-20 ENCOUNTER — Ambulatory Visit: Admitting: Orthopaedic Surgery

## 2023-10-20 ENCOUNTER — Other Ambulatory Visit (INDEPENDENT_AMBULATORY_CARE_PROVIDER_SITE_OTHER): Payer: Self-pay

## 2023-10-20 DIAGNOSIS — M25531 Pain in right wrist: Secondary | ICD-10-CM

## 2023-10-20 NOTE — Progress Notes (Signed)
 Office Visit Note   Patient: Chloe Rice           Date of Birth: 01-Sep-1995           MRN: [id number] Visit Date: 10/20/2023              Requested by: [facility] [address] [place],  [place] [address] PCP: [facility]   Assessment & Plan: Visit Diagnoses:  1. Pain in right wrist    History of Present Illness Chloe Rice is a 28 year old female who presents with right wrist pain and tingling.  She has experienced right wrist pain and tingling for two months, primarily affecting the thumb and pinky fingers. 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 There are no recent wrist injuries.  In 2014, she underwent an ulnar shortening osteotomy with initial plate insertion, followed by plate removal and further bone shortening due to persistent pain. Current pain is near the previous plate site.  Occasional numbness and tingling in her fingers occur, sometimes at night, especially when sleeping on her right side. She avoids this by sleeping on her left side. Her wrist produces a popping sound similar to knuckle popping.  Physical Exam CARDIOVASCULAR: Pulse normal. MUSCULOSKELETAL: Right wrist extension 70-75 degrees, flexion 90 degrees, radial deviation 15 degrees, ulnar deviation 25-30 degrees. Right wrist scar well-healed. NEUROLOGICAL: Positive Tinel's sign in right wrist.  Results RADIOLOGY Right wrist X-ray: Hardware intact, no loosening or screw backing out, osteotomy site healed, significant ulnar shortening  Assessment and Plan Carpal tunnel syndrome Chronic right wrist pain and tingling. No abnormalities in wrist hardware or osteotomy site on x-ray. - Provide wrist brace for use during work and at night. - Order nerve conduction study to confirm diagnosis. - Schedule follow-up after nerve study.  Follow-Up Instructions: No follow-ups on file.   Orders:  Orders  Placed This Encounter  Procedures   XR Wrist Complete Right   Ambulatory referral to Physical Medicine Rehab   No orders of the defined types were placed in this encounter.     Procedures: No procedures performed   Clinical Data: No additional findings.   Subjective: Chief Complaint  Patient presents with   Right Wrist - Pain    HPI  Review of Systems  Constitutional: Negative.   HENT: Negative.    Eyes: Negative.   Respiratory: Negative.    Cardiovascular: Negative.   Endocrine: Negative.   Musculoskeletal: Negative.   Neurological: Negative.   Hematological: Negative.   Psychiatric/Behavioral: Negative.    All other systems reviewed and are negative.    Objective: Vital Signs: There were no vitals taken for this visit.  Physical Exam Vitals and nursing note reviewed.  Constitutional:      Appearance: She is well-developed.  HENT:     Head: Atraumatic.     Nose: Nose normal.  Eyes:     Extraocular Movements: Extraocular movements intact.  Cardiovascular:     Pulses: Normal pulses.  Pulmonary:     Effort: Pulmonary effort is normal.  Abdominal:     Palpations: Abdomen is soft.  Musculoskeletal:     Cervical back: Neck supple.  Skin:    General: Skin is warm.     Capillary Refill: Capillary refill takes less than 2 seconds.  Neurological:     Mental Status: She is alert. Mental status is at baseline.  Psychiatric:  Behavior: Behavior normal.        Thought Content: Thought content normal.        Judgment: Judgment normal.     Ortho Exam  Specialty Comments:  No specialty comments available.  Imaging: XR Wrist Complete Right Result Date: 10/20/2023 X-rays of the right wrist show ulnar negative variance.  Prior hardware spanning the ulna from ulnar shortening osteotomy procedure.  There is no hardware complications.  The operative osteotomy site is fully consolidated.    PMFS History: Patient Active Problem List   Diagnosis Date  Noted   RUQ pain 08/03/2023   Biliary tract obstruction 08/03/2023   Gallstone pancreatitis 07/06/2023   Acute gallstone pancreatitis 07/05/2023   Past Medical History:  Diagnosis Date   Celiac disease since age 28   Heart murmur    as an infant, mother states she was cleared at age 36 mos.   Seizure [facility]) age 86   x 1; unknown cause, was not on anticonvulsant   Seizures (HCC) age 52 mos.   febrile x 2   Superficial swelling of scalp 06/2012   benign nodular swelling left occipital scalp    Family History  Problem Relation Age of Onset   Heart disease Maternal Grandmother        MI   Kidney disease Maternal Grandmother        was on dialysis   Stroke Paternal Grandmother    Heart disease Paternal Grandfather    Asthma Mother    Hypertension Father        treating with diet and exercise   Diabetes Maternal Uncle        juvenile    Past Surgical History:  Procedure Laterality Date   ARM HARDWARE REMOVAL Right 2013   CHOLECYSTECTOMY N/A 08/04/2023   Procedure: LAPAROSCOPIC CHOLECYSTECTOMY WITH ICG DYE;  Surgeon: Vernetta Berg, MD;  Location: [facility];  Service: General;  Laterality: N/A;   MASS EXCISION Left 06/14/2012   Procedure: EXCISION OF BENIGN OCCIPTAL SCALP NODULE;  Surgeon: CHRISTELLA. Julietta Millman, MD;  Location: [facility];  Service: Pediatrics;  Laterality: Left;   OSTEOTOMY AND ULNAR SHORTENING Right 2012   Social History   Occupational History   Not on file  Tobacco Use   Smoking status: Never   Smokeless tobacco: Never  Vaping Use   Vaping status: Every Day  Substance and Sexual Activity   Alcohol use: Yes    Comment: Social   Drug use: No   Sexual activity: Not on file

## 2023-11-10 ENCOUNTER — Ambulatory Visit: Admitting: Physical Medicine and Rehabilitation

## 2023-11-10 ENCOUNTER — Other Ambulatory Visit: Payer: Self-pay

## 2023-11-10 DIAGNOSIS — M79641 Pain in right hand: Secondary | ICD-10-CM | POA: Diagnosis not present

## 2023-11-10 DIAGNOSIS — K900 Celiac disease: Secondary | ICD-10-CM | POA: Insufficient documentation

## 2023-11-10 DIAGNOSIS — M25531 Pain in right wrist: Secondary | ICD-10-CM | POA: Diagnosis not present

## 2023-11-10 DIAGNOSIS — Z862 Personal history of diseases of the blood and blood-forming organs and certain disorders involving the immune mechanism: Secondary | ICD-10-CM | POA: Insufficient documentation

## 2023-11-10 DIAGNOSIS — R202 Paresthesia of skin: Secondary | ICD-10-CM

## 2023-11-10 DIAGNOSIS — R200 Anesthesia of skin: Secondary | ICD-10-CM

## 2023-11-10 DIAGNOSIS — F411 Generalized anxiety disorder: Secondary | ICD-10-CM | POA: Insufficient documentation

## 2023-11-10 NOTE — Progress Notes (Signed)
 Right hand numbness for two months Most in thumb and small finger

## 2023-11-11 NOTE — Procedures (Signed)
 EMG & NCV Findings: All nerve conduction studies (as indicated in the following tables) were within normal limits.    Needle evaluation of the right first dorsal interosseous muscle showed increased insertional activity and slightly increased spontaneous activity.  The right pronator teres muscle showed increased insertional activity and moderately increased spontaneous activity.  The right biceps muscle showed moderately increased spontaneous activity.  The right deltoid muscle showed moderately increased spontaneous activity and slightly increased polyphasic potentials.  The right triceps muscle showed increased spontaneous activity.  The right Ext Digitorum muscle showed increased insertional activity, increased spontaneous activity, and slightly increased polyphasic potentials.    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  The findings are interesting in that the clinical exam does not show much in the way of weakness or other findings.  This could represent a recovering Parsonage-Turner syndrome, brachial plexus lesion or CIDP but without really any clinical history to explain.  There were no fasciculations present.  There is no significant electrodiagnostic evidence of any other focal nerve entrapment  Recommendations: 1.  Follow-up with referring physician. 2.  Continue current management of symptoms. 3.  Suggest MRI of the brachial plexus and possibly cervical spine.  Depending on results consider neurology consultation.  ___________________________ Prentice Masters FAAPMR Board Certified, American Board of Physical Medicine and Rehabilitation    Nerve Conduction Studies Anti Sensory Summary Table   Stim Site NR Peak (ms) Norm Peak (ms) P-T Amp (V) Norm P-T Amp Site1 Site2 Delta-P (ms) Dist (cm) Vel (m/s) Norm Vel (m/s)  Right Median Acr Palm Anti  Sensory (2nd Digit)  29.6C  Wrist    3.3 <3.6 40.3 >10 Wrist Palm 1.5 0.0    Palm    1.8 <2.0 45.9         Right Radial Anti Sensory (Base 1st Digit)  30.1C  Wrist    2.1 <3.1 35.9  Wrist Base 1st Digit 2.1 0.0    Right Ulnar Anti Sensory (5th Digit)  30.3C  Wrist    3.3 <3.7 26.0 >15.0 Wrist 5th Digit 3.3 14.0 42 >38   Motor Summary Table   Stim Site NR Onset (ms) Norm Onset (ms) O-P Amp (mV) Norm O-P Amp Site1 Site2 Delta-0 (ms) Dist (cm) Vel (m/s) Norm Vel (m/s)  Right Median Motor (Abd Poll Brev)  30.6C  Wrist    3.2 <4.2 9.1 >5 Elbow Wrist 3.5 18.5 53 >50  Elbow    6.7  4.3         Right Ulnar Motor (Abd Dig Min)  30.9C  Wrist    3.2 <4.2 7.1 >3 B Elbow Wrist 3.1 18.5 60 >53  B Elbow    6.3  7.4  A Elbow B Elbow 1.3 11.0 85 >53  A Elbow    7.6  6.9          EMG   Side Muscle Nerve Root Ins Act Fibs Psw Amp Dur Poly Recrt Int Bruna Comment  Right 1stDorInt Ulnar C8-T1 *CRD *1+ *1+ Nml Nml 0 Nml Nml   Right PronatorTeres Median C6-7 *CRD *2+ *2+ Nml Nml 0 Nml Nml   Right Biceps Musculocut C5-6 Nml *2+ *2+ Nml Nml 0 Nml Nml   Right Deltoid Axillary C5-6 Nml *2+ *2+ Nml Nml *1+ Nml Nml   Right Triceps Radial C6-7-8 Nml *3+ *3+ Nml Nml 0 Nml Nml   Right Ext Digitorum  Radial (  Post Int) C7-8 *CRD *3+ *3+ Nml Nml *1+ Nml Nml     Nerve Conduction Studies Anti Sensory Left/Right Comparison   Stim Site L Lat (ms) R Lat (ms) L-R Lat (ms) L Amp (V) R Amp (V) L-R Amp (%) Site1 Site2 L Vel (m/s) R Vel (m/s) L-R Vel (m/s)  Median Acr Palm Anti Sensory (2nd Digit)  29.6C  Wrist  3.3   40.3  Wrist Palm     Palm  1.8   45.9        Radial Anti Sensory (Base 1st Digit)  30.1C  Wrist  2.1   35.9  Wrist Base 1st Digit     Ulnar Anti Sensory (5th Digit)  30.3C  Wrist  3.3   26.0  Wrist 5th Digit  42    Motor Left/Right Comparison   Stim Site L Lat (ms) R Lat (ms) L-R Lat (ms) L Amp (mV) R Amp (mV) L-R Amp (%) Site1 Site2 L Vel (m/s) R Vel (m/s) L-R Vel (m/s)  Median Motor (Abd Poll  Brev)  30.6C  Wrist  3.2   9.1  Elbow Wrist  53   Elbow  6.7   4.3        Ulnar Motor (Abd Dig Min)  30.9C  Wrist  3.2   7.1  B Elbow Wrist  60   B Elbow  6.3   7.4  A Elbow B Elbow  85   A Elbow  7.6   6.9           Waveforms:

## 2023-11-14 NOTE — Progress Notes (Signed)
 Chloe Rice - 28 y.o. female MRN [id number]  Date of birth: 10/06/1995  Office Visit Note: Visit Date: 11/10/2023 PCP: [facility] [place] Referred by: Jerri Kay HERO, MD  Subjective: Chief Complaint  Patient presents with   Right Hand - Numbness   HPI: Chloe Rice is a 28 y.o. female who comes in today at the request of Dr. Ozell Jerri for evaluation and management of chronic, worsening and severe pain, numbness and tingling in the Right upper extremities.  Patient is Right hand dominant.  She reports a couple of months of worsening right hand pain with paresthesia in particular the fifth digit and the thumb.  Somewhat nondermatomal.  She denies any specific injury although she thought initially it was related to her prior surgery that she had in the wrist.  She had had ulnar shortening osteotomy with plate removal.  She saw Dr. Jerri did not feel like it was coming directly from that particular injury and felt like maybe it was more nerve related such as carpal tunnel syndrome or similar.  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  She has had some difficulty using the hand.  She denies any swelling or color change.  She has had no specific injury that she is aware of.  No real radicular pain per se down the arm.  Some occasional neck pain.  No prior electrodiagnostic study.  She does have a history of acute gallstone pancreatitis and cholecystitis this year status post cholecystectomy.   I spent more than 30 minutes speaking face-to-face with the patient with 50% of the time in counseling and discussing coordination of care.       Review of Systems  Musculoskeletal:  Positive for joint pain.  Neurological:  Positive for tingling.  All other systems reviewed and are negative.  Otherwise per HPI.  Assessment & Plan: Visit Diagnoses:    ICD-10-CM   1. Paresthesia of skin  R20.2 NCV with EMG (electromyography)    2. Pain in right wrist  M25.531     3.  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  The findings are interesting in that the clinical exam does not show much in the way of weakness or other findings.  This could represent a recovering Parsonage-Turner syndrome, brachial plexus lesion or CIDP but without really any clinical history to explain.  There were no fasciculations present.  There is no significant electrodiagnostic evidence of any other focal nerve entrapment  Recommendations: 1.  Follow-up with referring physician. 2.  Continue current management of symptoms. 3.  Suggest MRI of the brachial plexus and possibly cervical spine.  Depending on results consider neurology consultation.  Meds & Orders: No orders of the defined types were placed in this encounter.   Orders Placed This Encounter  Procedures   NCV with EMG (electromyography)    Follow-up: Return for  Ozell Jerri, MD.   Procedures: No procedures performed  EMG & NCV Findings: All nerve conduction studies (as indicated in the following tables) were within normal limits.    Needle evaluation of the right first dorsal interosseous muscle showed increased insertional activity and slightly increased spontaneous activity.  The right pronator teres muscle showed increased insertional activity and moderately increased spontaneous activity.  The right biceps muscle showed  moderately increased spontaneous activity.  The right deltoid muscle showed moderately increased spontaneous activity and slightly increased polyphasic potentials.  The right triceps muscle showed increased spontaneous activity.  The right Ext Digitorum muscle showed increased insertional activity, increased spontaneous activity, and slightly increased polyphasic potentials.    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  The findings are interesting in that the clinical exam does not show much in the way of weakness or other findings.  This could represent a recovering Parsonage-Turner syndrome, brachial plexus lesion or CIDP but without really any clinical history to explain.  There were no fasciculations present.  There is no significant electrodiagnostic evidence of any other focal nerve entrapment  Recommendations: 1.  Follow-up with referring physician. 2.  Continue current management of symptoms. 3.  Suggest MRI of the brachial plexus and possibly cervical spine.  Depending on results consider neurology consultation.  ___________________________ Prentice Masters FAAPMR Board Certified, American Board of Physical Medicine and Rehabilitation    Nerve Conduction Studies Anti Sensory Summary Table   Stim Site NR Peak (ms) Norm Peak (ms) P-T Amp (V) Norm P-T Amp Site1 Site2 Delta-P (ms) Dist (cm) Vel (m/s) Norm Vel (m/s)  Right Median Acr Palm Anti Sensory (2nd Digit)  29.6C  Wrist    3.3 <3.6 40.3 >10 Wrist Palm 1.5 0.0    Palm    1.8 <2.0 45.9         Right Radial Anti Sensory (Base 1st Digit)  30.1C  Wrist    2.1 <3.1 35.9  Wrist Base 1st Digit 2.1 0.0    Right Ulnar Anti Sensory (5th Digit)  30.3C  Wrist    3.3 <3.7 26.0 >15.0 Wrist 5th Digit 3.3 14.0 42 >38   Motor Summary Table   Stim Site NR Onset (ms) Norm Onset (ms) O-P Amp (mV) Norm O-P Amp Site1 Site2 Delta-0 (ms) Dist (cm) Vel (m/s) Norm Vel (m/s)  Right Median Motor (Abd Poll Brev)  30.6C  Wrist    3.2 <4.2 9.1 >5 Elbow Wrist 3.5 18.5 53 >50  Elbow    6.7  4.3         Right Ulnar Motor (Abd Dig Min)  30.9C  Wrist    3.2 <4.2 7.1 >3 B Elbow Wrist 3.1 18.5 60 >53  B Elbow    6.3  7.4  A Elbow B Elbow 1.3 11.0 85 >53  A Elbow    7.6  6.9          EMG   Side Muscle Nerve Root Ins Act Fibs Psw Amp Dur  Poly Recrt Int Bruna Comment  Right 1stDorInt Ulnar C8-T1 *CRD *1+ *1+ Nml Nml 0 Nml Nml   Right PronatorTeres Median C6-7 *CRD *2+ *2+ Nml Nml 0 Nml Nml   Right Biceps Musculocut C5-6 Nml *2+ *2+ Nml Nml 0 Nml Nml   Right Deltoid Axillary C5-6 Nml *2+ *2+ Nml Nml *1+ Nml Nml   Right Triceps Radial C6-7-8 Nml *3+ *3+ Nml Nml 0 Nml Nml   Right Ext Digitorum  Radial (Post Int) C7-8 *CRD *3+ *3+ Nml Nml *1+ Nml Nml     Nerve Conduction Studies Anti Sensory Left/Right Comparison   Stim Site L Lat (ms) R Lat (ms) L-R Lat (ms) L Amp (V) R Amp (V) L-R Amp (%) Site1 Site2 L Vel (m/s) R Vel (m/s) L-R Vel (m/s)  Median Acr [employer]  Anti Sensory (2nd Digit)  29.6C  Wrist  3.3   40.3  Wrist Palm     Palm  1.8   45.9        Radial Anti Sensory (Base 1st Digit)  30.1C  Wrist  2.1   35.9  Wrist Base 1st Digit     Ulnar Anti Sensory (5th Digit)  30.3C  Wrist  3.3   26.0  Wrist 5th Digit  42    Motor Left/Right Comparison   Stim Site L Lat (ms) R Lat (ms) L-R Lat (ms) L Amp (mV) R Amp (mV) L-R Amp (%) Site1 Site2 L Vel (m/s) R Vel (m/s) L-R Vel (m/s)  Median Motor (Abd Poll Brev)  30.6C  Wrist  3.2   9.1  Elbow Wrist  53   Elbow  6.7   4.3        Ulnar Motor (Abd Dig Min)  30.9C  Wrist  3.2   7.1  B Elbow Wrist  60   B Elbow  6.3   7.4  A Elbow B Elbow  85   A Elbow  7.6   6.9           Waveforms:            Clinical History: 10/20/2023 X-rays of the right wrist show ulnar negative variance.  Prior hardware  spanning the ulna from ulnar shortening osteotomy procedure.  There is no  hardware complications.  The operative osteotomy site is fully  consolidated.   She reports that she has never smoked. She has never used smokeless tobacco. No results for input(s): HGBA1C, LABURIC in the last 8760 hours.  Objective:  VS:  HT:    WT:   BMI:     BP:   HR: bpm  TEMP: ( )  RESP:  Physical Exam Vitals and nursing note reviewed.  Constitutional:      General: She is not in acute  distress.    Appearance: Normal appearance. She is well-developed. She is not ill-appearing.  HENT:     Head: Normocephalic and atraumatic.  Eyes:     Conjunctiva/sclera: Conjunctivae normal.     Pupils: Pupils are equal, round, and reactive to light.  Cardiovascular:     Rate and Rhythm: Normal rate.     Pulses: Normal pulses.  Pulmonary:     Effort: Pulmonary effort is normal.  Musculoskeletal:        General: Tenderness present. No swelling or deformity.     Right lower leg: No edema.     Left lower leg: No edema.     Comments: Inspection reveals no atrophy of the bilateral APB or FDI or hand intrinsics. There is no swelling, color changes, allodynia or dystrophic changes. There is 5 out of 5 strength in the bilateral wrist extension, finger abduction and long finger flexion. There is intact sensation to light touch in all dermatomal and peripheral nerve distributions. There is a negative Tinel's test at the bilateral wrist and elbow. There is a negative Phalen's test bilaterally. There is a negative Hoffmann's test bilaterally.  Skin:    General: Skin is warm and dry.     Findings: No erythema or rash.  Neurological:     General: No focal deficit present.     Mental Status: She is alert and oriented to person, place, and time.     Cranial Nerves: No cranial nerve deficit.     Sensory: No sensory deficit.     Motor: No  weakness or abnormal muscle tone.     Coordination: Coordination normal.     Gait: Gait normal.  Psychiatric:        Mood and Affect: Mood normal.        Behavior: Behavior normal.     Ortho Exam  Imaging: No results found.  Past Medical/Family/Surgical/Social History: Medications & Allergies reviewed per EMR, new medications updated. Patient Active Problem List   Diagnosis Date Noted   Celiac disease 11/10/2023   Generalized anxiety disorder 11/10/2023   History of iron deficiency anemia 11/10/2023   RUQ pain 08/03/2023   Biliary tract obstruction  08/03/2023   Gallstone pancreatitis 07/06/2023   Acute gallstone pancreatitis 07/05/2023   Past Medical History:  Diagnosis Date   Celiac disease since age 72   Heart murmur    as an infant, mother states she was cleared at age 52 mos.   Seizure [facility]) age 8   x 1; unknown cause, was not on anticonvulsant   Seizures ([facility]) age 21 mos.   febrile x 2   Superficial swelling of scalp 06/2012   benign nodular swelling left occipital scalp   Family History  Problem Relation Age of Onset   Heart disease Maternal Grandmother        MI   Kidney disease Maternal Grandmother        was on dialysis   Stroke Paternal Grandmother    Heart disease Paternal Grandfather    Asthma Mother    Hypertension Father        treating with diet and exercise   Diabetes Maternal Uncle        juvenile   Past Surgical History:  Procedure Laterality Date   ARM HARDWARE REMOVAL Right 2013   CHOLECYSTECTOMY N/A 08/04/2023   Procedure: LAPAROSCOPIC CHOLECYSTECTOMY WITH ICG DYE;  Surgeon: Vernetta Berg, MD;  Location: [facility];  Service: General;  Laterality: N/A;   MASS EXCISION Left 06/14/2012   Procedure: EXCISION OF BENIGN OCCIPTAL SCALP NODULE;  Surgeon: CHRISTELLA. Julietta Millman, MD;  Location: [facility];  Service: Pediatrics;  Laterality: Left;   OSTEOTOMY AND ULNAR SHORTENING Right 2012   Social History   Occupational History   Not on file  Tobacco Use   Smoking status: Never   Smokeless tobacco: Never  Vaping Use   Vaping status: Every Day  Substance and Sexual Activity   Alcohol use: Yes    Comment: Social   Drug use: No   Sexual activity: Not on file

## 2023-11-19 ENCOUNTER — Encounter: Payer: Self-pay | Admitting: Physical Medicine and Rehabilitation

## 2023-11-20 ENCOUNTER — Telehealth: Payer: Self-pay

## 2023-11-20 NOTE — Telephone Encounter (Signed)
 Called patient no answer. Per Dr. JERRI- He would like to get an MRI. Order is made. She needs to call [facility] imaging [phone] To get scheduled.

## 2023-11-24 ENCOUNTER — Encounter: Payer: Self-pay | Admitting: Orthopaedic Surgery

## 2023-11-24 ENCOUNTER — Ambulatory Visit: Admitting: Orthopaedic Surgery

## 2023-11-24 DIAGNOSIS — R200 Anesthesia of skin: Secondary | ICD-10-CM | POA: Diagnosis not present

## 2023-11-24 NOTE — Progress Notes (Signed)
 History of Present Illness Nolan Lasser is a 28 year old female who presents with widespread muscle denervation findings on EMG.   I referred her to Dr. Eldonna for nerve conduction study and EMGs.  Widespread muscle denervation was found on EMG.  Examination of the right upper extremity is unchanged from prior visit.  Results DIAGNOSTIC EMG: Widespread denervation  Assessment and Plan Widespread muscle denervation of unclear etiology EMG shows muscle denervation, likely neurological. Differential includes cervical origin conditions, Parsonage-Turner syndrome. Further investigation needed. - Order MRI of the brachial plexus pending approval. - Refer to neurology for further evaluation.

## 2023-11-27 NOTE — Addendum Note (Signed)
 Addended by: Winnona Wargo on: 11/27/2023 12:24 PM   Modules accepted: Orders

## 2023-12-05 ENCOUNTER — Encounter: Payer: Self-pay | Admitting: Neurology

## 2023-12-13 ENCOUNTER — Ambulatory Visit (HOSPITAL_COMMUNITY)
Admission: RE | Admit: 2023-12-13 | Discharge: 2023-12-13 | Disposition: A | Discharge status: Home / Self Care | Source: Ambulatory Visit | Attending: Orthopaedic Surgery | Admitting: Orthopaedic Surgery

## 2023-12-13 DIAGNOSIS — R200 Anesthesia of skin: Secondary | ICD-10-CM | POA: Insufficient documentation

## 2024-01-15 ENCOUNTER — Ambulatory Visit: Admitting: Neurology

## 2024-01-15 ENCOUNTER — Encounter: Payer: Self-pay | Admitting: Neurology

## 2024-01-15 VITALS — BP 123/83 | HR 82 | Ht 62.0 in | Wt 205.0 lb

## 2024-01-15 DIAGNOSIS — R202 Paresthesia of skin: Secondary | ICD-10-CM

## 2024-01-15 DIAGNOSIS — M25531 Pain in right wrist: Secondary | ICD-10-CM

## 2024-01-15 NOTE — Patient Instructions (Signed)
 Nerve testing of both arms  ELECTROMYOGRAM AND NERVE CONDUCTION STUDIES (EMG/NCS) INSTRUCTIONS  How to Prepare The neurologist conducting the EMG will need to know if you have certain medical conditions. Tell the neurologist and other EMG lab personnel if you: Have a pacemaker or any other electrical medical device Take blood-thinning medications Have hemophilia, a blood-clotting disorder that causes prolonged bleeding Bathing Take a shower or bath shortly before your exam in order to remove oils from your skin. Don't apply lotions or creams before the exam.  What to Expect You'll likely be asked to change into a hospital gown for the procedure and lie down on an examination table. The following explanations can help you understand what will happen during the exam.  Electrodes. The neurologist or a technician places surface electrodes at various locations on your skin depending on where you're experiencing symptoms. Or the neurologist may insert needle electrodes at different sites depending on your symptoms.  Sensations. The electrodes will at times transmit a tiny electrical current that you may feel as a twinge or spasm. The needle electrode may cause discomfort or pain that usually ends shortly after the needle is removed. If you are concerned about discomfort or pain, you may want to talk to the neurologist about taking a short break during the exam.  Instructions. During the needle EMG, the neurologist will assess whether there is any spontaneous electrical activity when the muscle is at rest - activity that isn't present in healthy muscle tissue - and the degree of activity when you slightly contract the muscle.  He or she will give you instructions on resting and contracting a muscle at appropriate times. Depending on what muscles and nerves the neurologist is examining, he or she may ask you to change positions during the exam.  After your EMG You may experience some temporary, minor  bruising where the needle electrode was inserted into your muscle. This bruising should fade within several days. If it persists, contact your primary care doctor.

## 2024-01-15 NOTE — Progress Notes (Signed)
 [facility] Neurology Division Clinic Note - Initial Visit   Date: 01/15/2024   Chloe Rice MRN: [id number] DOB: 07-11-1995   Dear Dr. Jerri:  Thank you for your kind referral of Chloe Rice for consultation of right wrist pain and numbness. Although her history is well known to you, please allow us  to reiterate it for the purpose of our medical record. The patient was accompanied to the clinic by mother who also provides collateral information.     Chloe Rice is a 28 y.o. right-handed female with depression/anxiety and Celiac disease presenting for evaluation of right wrist pain and numbness.   IMPRESSION/PLAN: Right wrist pain and hand paresthesia (worse over the 5th digit and thumb).  Exam shows diminished sensation over the right 5th digit and wrist pain with ROM testing.  Her motor strength is intact throughout.  Prior NCS/EMG shows widespread active denervation on needle exam, with normal sensory and motor responses.  Of note, mixed palmer sensory response is prolonged and can be seen with carpal tunnel syndrome.  It would be atypical to have diffuse active denervation without weakness.  By history, she seems to have symptoms more consistent with entrapment neuropathy.  I will plan to repeat NCS/EMG and compared to the left side.  In the meantime, strategies to avoid nerve compression at the wrist and elbow discussed.   Further recommendations pending results.   ------------------------------------------------------------- History of present illness: Starting in June 2025, she began noticing right thumb and 5th finger numbness, which was mostly noticeable when at work.  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  Later, she began having wrist pain and went to see orthopeadics.  She had NCS/EMG which shows widespread active denervation affecting nearly all the tested muscles, concerning for multilevel radiculopathy vs brachial plexopathy.  MRI brachial plexus was  normal.  She tried wearing a brace which did not help.   She denies similar symptoms in the left arm. She has achy right side neck pain, no shooting pain.   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  However, her current wrist pain feels different.   She works as a Agricultural engineer.  Nonsmoker.  Occasional alcohol use.     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  The findings are interesting in that the clinical exam does not show much in the way of weakness or other findings.  This could represent a recovering Parsonage-Turner syndrome, brachial plexus lesion or CIDP but without really any clinical history to explain.  There were no fasciculations present.   There is no significant electrodiagnostic evidence of any other focal nerve entrapment  MRI brachial plexus wo contrast 12/20/2023:   Unremarkable MRI of the right brachial plexus.    Past Medical History:  Diagnosis Date   Celiac disease since age 23   Heart murmur    as an infant, mother states she was cleared at age 45 mos.   Seizure [facility]) age 69   x 1; unknown cause, was not on anticonvulsant   Seizures (HCC) age 72 mos.   febrile x 2   Superficial swelling of scalp 06/2012   benign nodular swelling left occipital scalp  Past Surgical History:  Procedure Laterality Date   ARM HARDWARE REMOVAL Right 2013   CHOLECYSTECTOMY N/A 08/04/2023   Procedure: LAPAROSCOPIC CHOLECYSTECTOMY WITH ICG DYE;  Surgeon: Vernetta Berg, MD;  Location: [facility];  Service: General;  Laterality: N/A;   MASS EXCISION Left 06/14/2012   Procedure: EXCISION OF BENIGN OCCIPTAL SCALP NODULE;  Surgeon: CHRISTELLA. Julietta Millman, MD;  Location: [facility];  Service: Pediatrics;  Laterality: Left;   OSTEOTOMY AND ULNAR SHORTENING Right 2012     Medications:  Outpatient Encounter Medications as of 01/15/2024  Medication Sig   acetaminophen  (TYLENOL ) 325 MG tablet Take 2 tablets (650 mg total) by mouth every 6 (six) hours as needed for mild pain (pain score 1-3) (or Fever >/= 101).   hydrOXYzine  (ATARAX ) 50 MG tablet Take 25-50 mg by mouth daily as needed (for sleep or panic).   ondansetron  (ZOFRAN -ODT) 4 MG disintegrating tablet Take 1 tablet (4 mg total) by mouth every 8 (eight) hours as needed for nausea or vomiting.   sertraline  (ZOLOFT ) 50 MG tablet Take 50 mg by mouth daily.   SLYND 4 MG TABS Take 4 mg by mouth daily.   HYDROcodone -acetaminophen  (NORCO/VICODIN) 5-325 MG tablet Take 1 tablet by mouth every 4 (four) hours as needed for moderate pain (pain score 4-6). (Patient not taking: Reported on 01/15/2024)   medroxyPROGESTERone  (DEPO-PROVERA ) 150 MG/ML injection INJECT 1 ML INTO THE MUSCLE AS DIRECTED (Patient not taking: Reported on 01/15/2024)   pantoprazole  (PROTONIX ) 40 MG tablet Take 1 tablet (40 mg total) by mouth daily. (Patient not taking: Reported on 01/15/2024)   traMADol  (ULTRAM ) 50 MG tablet Take 1 tablet (50 mg total) by mouth every 6 (six) hours as needed for moderate pain (pain score 4-6). (Patient not taking: Reported on 01/15/2024)   No facility-administered encounter medications on file as of 01/15/2024.    Allergies:  Allergies  Allergen Reactions   Gluten Meal Other (See Comments)    CELIAC DISEASE and Abdominal Pain   Oxycodone  Anxiety and Other (See Comments)    Uncontrollable shaking (advised not to consume) and muscle spasms, also   Morphine      Family History: Family History  Problem Relation Age of Onset   Asthma Mother    Hypertension Father        treating with diet and exercise   Diabetes Maternal Uncle        juvenile   Heart disease Maternal  Grandmother        MI   Kidney disease Maternal Grandmother        was on dialysis   Stroke Paternal Grandmother    Heart disease Paternal Grandfather     Social History: Social History   Tobacco Use   Smoking status: Never   Smokeless tobacco: Never  Vaping Use   Vaping status: Former  Substance Use Topics   Alcohol use: Yes    Comment: Social Drinking   Drug use: No   Social History   Social History Narrative   Are you right handed or left handed? Right Handed    Are you currently employed ? Yes   What is your current occupation? [employer] Dis[employer]    Do you live at home alone? Yes   Who lives with you? No one    What type of home do you live in: 1 story or 2 story? Lives in a two story home         Vital Signs:  BP 123/83   Pulse  82   Ht 5' 2 (1.575 m)   Wt 205 lb (93 kg)   SpO2 96%   BMI 37.49 kg/m    Neurological Exam: MENTAL STATUS including orientation to time, place, person, recent and remote memory, attention span and concentration, language, and fund of knowledge is normal.  Speech is not dysarthric.  CRANIAL NERVES: II:  No visual field defects.     III-IV-VI: Pupils equal round and reactive to light.  Normal conjugate, extra-ocular eye movements in all directions of gaze.  No nystagmus.  No ptosis.   V:  Normal facial sensation.    VII:  Normal facial symmetry and movements.   VIII:  Normal hearing and vestibular function.   IX-X:  Normal palatal movement.   XI:  Normal shoulder shrug and head rotation.   XII:  Normal tongue strength and range of motion, no deviation or fasciculation.  MOTOR:  No atrophy, fasciculations or abnormal movements.  No pronator drift.   Upper Extremity:  Right  Left  Deltoid  5/5   5/5   Biceps  5/5   5/5   Triceps  5/5   5/5   Wrist extensors  5/5   5/5   Wrist flexors  5/5   5/5   Finger extensors  5/5   5/5   Finger flexors  5/5   5/5   Dorsal interossei  5/5   5/5   Abductor pollicis  5/5   5/5   Tone  (Ashworth scale)  0  0   Lower Extremity:  Right  Left  Hip flexors  5/5   5/5   Knee flexors  5/5   5/5   Knee extensors  5/5   5/5   Dorsiflexors  5/5   5/5   Plantarflexors  5/5   5/5   Toe extensors  5/5   5/5   Toe flexors  5/5   5/5   Tone (Ashworth scale)  0  0   MSRs:                                           Right        Left brachioradialis 2+  2+  biceps 2+  2+  triceps 2+  2+  patellar 2+  2+  ankle jerk 2+  2+  Hoffman no  no  plantar response down  down   SENSORY:  Reduced pin prick over the right 5th finger, otherwise sensation to all modalities is intact.  COORDINATION/GAIT: Normal finger-to- nose-finger.  Intact rapid alternating movements bilaterally.   Gait narrow based and stable. Tandem and stressed gait intact.    Thank you for allowing me to participate in patient's care.  If I can answer any additional questions, I would be pleased to do so.    Sincerely,    Jacquise Rarick K. Tobie, DO

## 2024-02-05 ENCOUNTER — Encounter: Payer: Self-pay | Admitting: Radiology

## 2024-02-09 ENCOUNTER — Ambulatory Visit: Admitting: Neurology

## 2024-02-09 DIAGNOSIS — M25531 Pain in right wrist: Secondary | ICD-10-CM

## 2024-02-09 DIAGNOSIS — G729 Myopathy, unspecified: Secondary | ICD-10-CM

## 2024-02-09 NOTE — Procedures (Signed)
 [facility] Neurology  [address], [address]  [place], [place] [address] Tel: [phone] Fax: [phone] Test Date:  02/09/2024  Patient: Chloe Rice DOB: 09/09/1995 Physician: Tonita Blanch, DO  Sex: Female Height: 5' 2 Ref Phys: Tonita Blanch, DO  ID#: [id number]   Technician:    History: This is a 28 year old female referred for evaluation of right wrist pain.  NCV & EMG Findings: Extensive electrodiagnostic testing of the right upper extremity and additional studies of the left shows:  Bilateral median, ulnar, and mixed palmar sensory responses are within normal limits. Bilateral median and ulnar motor responses are within normal limits. Needle electrode examination shows widespread myotonic discharges along with small polyphasic rapidly recruiting motor unit potentials with fibrillation potentials in many of the tested muscles bilaterally.  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  Clinical correlation recommended. There is no evidence of carpal tunnel syndrome, ulnar neuropathy, or cervical radiculopathy affecting the upper extremities.   ___________________________ Tonita Blanch, DO    Nerve Conduction Studies   Stim Site NR Peak (ms) Norm Peak (ms) O-P Amp (V) Norm O-P Amp  Left Median Anti Sensory (2nd Digit)  32 C  Wrist    2.9 <3.3 52.5 >20  Right Median Anti Sensory (2nd Digit)  32 C  Wrist    2.6 <3.3 50.8 >20  Left Ulnar Anti Sensory (5th Digit)  32 C  Wrist    2.6 <3.0 47.7 >18  Right Ulnar Anti Sensory (5th Digit)  32 C  Wrist    2.4 <3.0 41.1 >18     Stim Site NR Onset (ms) Norm Onset (ms) O-P Amp (mV) Norm O-P Amp Site1 Site2 Delta-0 (ms) Dist (cm) Vel (m/s) Norm Vel (m/s)  Left Median Motor (Abd Poll Brev)  32 C  Wrist    3.0 <3.9 8.1 >6 Elbow Wrist 4.7 29.0 62 >51  Elbow    7.7  7.9         Right Median Motor (Abd Poll Brev)  32 C  Wrist    2.7 <3.9 9.6 >6  Elbow Wrist 5.0 30.0 60 >51  Elbow    7.7  9.5         Left Ulnar Motor (Abd Dig Minimi)  32 C  Wrist    2.3 <3.0 8.2 >8 B Elbow Wrist 3.2 20.0 63 >51  B Elbow    5.5  7.3  A Elbow B Elbow 1.5 10.0 67 >51  A Elbow    7.0  7.2         Right Ulnar Motor (Abd Dig Minimi)  32 C  Wrist    2.1 <3.0 8.7 >8 B Elbow Wrist 3.1 19.0 61 >51  B Elbow    5.2  8.5  A Elbow B Elbow 1.6 10.0 63 >51  A Elbow    6.8  8.4            Stim Site NR Peak (ms) Norm Peak (ms) P-T Amp (V) Site1 Site2 Delta-P (ms) Norm Delta (ms)  Left Median/Ulnar Palm Comparison (Wrist - 8cm)  32 C  Median Palm    1.8 <2.2 60.7 Median Palm Ulnar Palm 0.3   Ulnar Palm    1.5 <2.2 14.3      Right Median/Ulnar Palm Comparison (Wrist - 8cm)  32 C  Median Palm    1.5 <2.2 71.3 Median Palm Ulnar Palm 0.2   Ulnar Palm    1.3 <  2.2 27.9       Electromyography   Side Muscle Ins.Act Fibs Fasc Recrt Amp Dur Poly Activation Comment  Right 1stDorInt *MTP *1+ Nml *Early *1- *1+ *1+ Nml N/A  Right PronatorTeres *MTP *1+ Nml *Early *1- *1+ *1+ Nml N/A  Right Biceps *MTP *1+ Nml *Early *1- *1+ *1+ Nml N/A  Right Triceps Nml *1+ Nml Nml Nml Nml Nml Nml N/A  Right Deltoid Nml *1+ Nml Nml Nml Nml Nml Nml N/A  Left PronatorTeres *MTP *1+ Nml *Early *1+ *1+ *1+ Nml N/A  Left 1stDorInt *MTP *1+ Nml *Early *1+ *1+ *1+ Nml N/A  Left Biceps Nml *1+ Nml *Early *1+ *1+ *1+ Nml N/A  Left Triceps Nml *1+ Nml Nml Nml Nml Nml Nml N/A  Left Deltoid Nml *1+ Nml Nml Nml Nml Nml Nml N/A  Right Cervical Parasp Low Nml Nml Nml Nml Nml Nml Nml Nml N/A  Right Ext Indicis *MTP *1+ Nml *Early *1+ *1+ *1+ Nml N/A      Waveforms:

## 2024-02-13 ENCOUNTER — Encounter: Payer: Self-pay | Admitting: Neurology

## 2024-02-13 ENCOUNTER — Ambulatory Visit: Admitting: Neurology

## 2024-02-13 VITALS — BP 104/76 | HR 113 | Ht 62.0 in | Wt 210.0 lb

## 2024-02-13 DIAGNOSIS — G729 Myopathy, unspecified: Secondary | ICD-10-CM | POA: Diagnosis not present

## 2024-02-13 NOTE — Progress Notes (Unsigned)
 Follow-up Visit   Date: 02/13/2024    Chloe Rice MRN: [id number] DOB: 04/17/1995    Chloe Rice is a 28 y.o. right-handed Caucasian female with depression/anxiety and Celiac disease returning to the clinic for follow-up of hand pain and weakness.  The patient was accompanied to the clinic by mother who also provides collateral information.    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  Exam show mild facial weakness, distal upper extremity weakness, and percussion myotonia.  EMG showed diffuse myotonic discharges and fibrillation potentials, along with sparse myopathic motor units, which raises concern for myopathic process.  She also has history of early cataracts which can be seen with this condition.  I will check gene testing for myotonic dystrophy type 1, DMPK repeat expansion.  If this is negative, additional myopathy labs will be ordered, including CK (this was not drawn today as she had EMG a few days ago, which can falsely elevate CK).   Further recommendations pending results.  --------------------------------------------- History of present illness: Starting in June 2025, she began noticing right thumb and 5th finger numbness, which was mostly noticeable when at work.  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  Later, she began having wrist pain and went to see orthopeadics.  She had NCS/EMG which shows widespread active denervation affecting nearly all the tested muscles, concerning for multilevel radiculopathy vs brachial plexopathy.  MRI brachial plexus was normal.  She tried wearing a brace which did not help.   She denies similar symptoms in the left arm. She has achy right side neck pain, no shooting pain.   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  However, her current wrist pain feels different.   She works as a agricultural engineer.   Nonsmoker.  Occasional alcohol use.    UPDATE 02/13/2024:  She is here for follow-up visit to discuss results of EMG.  She is accompanied by mother.  She continues to have right wrist pain, hand weakness, muscle pain and aches involving the neck and upper arm. She had stiffness in the hands.   Upon further questioning, she has history of bilateral cataract surgery in her early 46s.  Her father also had cataract surgery in his 74s.  No family history of neuromuscular disorder.    Medications:  Current Outpatient Medications on File Prior to Visit  Medication Sig Dispense Refill   acetaminophen  (TYLENOL ) 325 MG tablet Take 2 tablets (650 mg total) by mouth every 6 (six) hours as needed for mild pain (pain score 1-3) (or Fever >/= 101).     hydrOXYzine  (ATARAX ) 50 MG tablet Take 25-50 mg by mouth daily as needed (for sleep or panic).     ondansetron  (ZOFRAN -ODT) 4 MG disintegrating tablet Take 1 tablet (4 mg total) by mouth every 8 (eight) hours as needed for nausea or vomiting. 20 tablet 0   sertraline  (ZOLOFT ) 50 MG tablet Take 50 mg by mouth daily.     SLYND 4 MG TABS Take 4 mg by mouth daily.     HYDROcodone -acetaminophen  (NORCO/VICODIN) 5-325 MG tablet Take 1 tablet by mouth every 4 (four) hours as needed for moderate pain (pain score 4-6). (Patient not taking: Reported on 02/13/2024) 30 tablet 0   medroxyPROGESTERone  (DEPO-PROVERA ) 150 MG/ML injection INJECT 1 ML INTO THE MUSCLE AS DIRECTED (Patient not taking: Reported on 02/13/2024) 1 mL 1   pantoprazole  (PROTONIX )  40 MG tablet Take 1 tablet (40 mg total) by mouth daily. (Patient not taking: Reported on 02/13/2024) 30 tablet 0   traMADol  (ULTRAM ) 50 MG tablet Take 1 tablet (50 mg total) by mouth every 6 (six) hours as needed for moderate pain (pain score 4-6). (Patient not taking: Reported on 02/13/2024) 30 tablet 0   No current facility-administered medications on file prior to visit.    Allergies:  Allergies  Allergen Reactions   Gluten  Meal Other (See Comments)    CELIAC DISEASE and Abdominal Pain   Oxycodone  Anxiety and Other (See Comments)    Uncontrollable shaking (advised not to consume) and muscle spasms, also   Morphine      Vital Signs:  BP 104/76   Pulse (!) 113   Ht 5' 2 (1.575 m)   Wt 210 lb (95.3 kg)   SpO2 98%   BMI 38.41 kg/m    Neurological Exam: MENTAL STATUS including orientation to time, place, person, recent and remote memory, attention span and concentration, language, and fund of knowledge is normal.  Speech is not dysarthric.  CRANIAL NERVES:  No visual field defects.  Pupils equal round and reactive to light.  Normal conjugate, extra-ocular eye movements in all directions of gaze.  No ptosis.  Face is symmetric, mild transverse smile.  This is trace weakness of orbicularis oculi.  Weakness is present with buccinator testing.  Palate elevates symmetrically.  Tongue is midline.  MOTOR:  No atrophy, fasciculations or abnormal movements.  No pronator drift.  Percussion myotonia is present at the ABP bilaterally.  No grip or eyelid myotonia.   Upper Extremity:  Right  Left  Deltoid  5/5   5/5   Biceps  5/5   5/5   Triceps  5/5   5/5   Wrist extensors  5-/5   5-/5   Wrist flexors  4/5   4/5   Finger extensors  5-/5   5-/5   Finger flexors  5-/5   5-/5   Dorsal interossei  4/5   4/5   Tone (Ashworth scale)  0  0   Lower Extremity:  Right  Left  Hip flexors  5/5   5/5   Hip extensors  5/5   5/5   Adductor 5/5  5/5  Abductor 5/5  5/5  Knee flexors  5/5   5/5   Knee extensors  5/5   5/5   Dorsiflexors  5/5   5/5   Plantarflexors  5/5   5/5   Toe extensors  5/5   5/5   Toe flexors  5/5   5/5   Tone (Ashworth scale)  0  0   MSRs:  Reflexes are 2+/4 throughout.  SENSORY:  Intact to vibration throughout.  COORDINATION/GAIT:    Gait narrow based and stable.   Data: NCS/EMG of the upper extremities 02/09/2024: The electrophysiologic findings show diffuse myotonic discharges in the setting  of an irritable myopathy.  Clinical correlation recommended. There is no evidence of carpal tunnel syndrome, ulnar neuropathy, or cervical radiculopathy affecting the upper extremities.    Thank you for allowing me to participate in patient's care.  If I can answer any additional questions, I would be pleased to do so.    Sincerely,    Crystalynn Mcinerney K. Tobie, DO

## 2024-02-13 NOTE — Patient Instructions (Signed)
 Genetic testing for myotonic dystrophy

## 2024-02-28 LAB — DMPK REPEAT EXPANSION

## 2024-03-04 ENCOUNTER — Telehealth: Payer: Self-pay | Admitting: Neurology

## 2024-03-04 NOTE — Telephone Encounter (Signed)
 Pt calling for results, if she does not answer please call Mom (maureen) [phone]

## 2024-03-04 NOTE — Telephone Encounter (Signed)
 Called and let patient know per Dr. Tobie results for labs are not back at this time. Told her we would call when we get the results back. She understood.

## 2024-03-06 ENCOUNTER — Ambulatory Visit: Payer: Self-pay | Admitting: Neurology

## 2024-03-07 ENCOUNTER — Telehealth: Payer: Self-pay | Admitting: Neurology

## 2024-03-07 NOTE — Telephone Encounter (Signed)
 Who's calling (name and relationship to patient) : Deland Rhyme; mom   Best contact number: [phone]  Provider they see: Dr. Tobie   Reason for call:  Mom called regarding blood test. She stated that its been 3 weeks, and wanted to know how much longer it would be. She  stated that [place]  was being tested for Mytonic- dystrophy, and genetics test. Her concern is wanting to know if she was being tested for type 1 or 2 or both. Mom is requesting a call back.

## 2024-03-07 NOTE — Telephone Encounter (Signed)
 Pending results form [facility]

## 2024-03-07 NOTE — Telephone Encounter (Signed)
 [employer] [phone]. Spoke to Chantel and informed her that we are still awaiting PDF test results. Chantel informed me that results are ready and will be faxed over ASAP.

## 2024-03-07 NOTE — Telephone Encounter (Signed)
 Called patients mother Deland per [place] and informed her that genetic testing was positive and that Dr. Tobie would like to bring Chloe Rice back in office 03/13/24 at 10:10am. Patient mother stated to go ahead and schedule patient for that follow up and she will inform patient about positive results.

## 2024-03-07 NOTE — Telephone Encounter (Signed)
 Please let pt know that genetic testing did come back positive. I would like to see her back in the office to review this. Add follow-up on 12/10 at 10:10a. Thanks.

## 2024-03-13 ENCOUNTER — Encounter: Payer: Self-pay | Admitting: Neurology

## 2024-03-13 ENCOUNTER — Ambulatory Visit: Admitting: Neurology

## 2024-03-13 VITALS — BP 118/87 | HR 86 | Ht 62.0 in | Wt 208.0 lb

## 2024-03-13 DIAGNOSIS — G7111 Myotonic muscular dystrophy: Secondary | ICD-10-CM

## 2024-03-13 NOTE — Patient Instructions (Signed)
 Referral to genetic counselor and [facility]   Diabetes screening with HbA1c through primary care doctors office  Talk to your OGBYN about risk of infertility associated with this condition

## 2024-03-13 NOTE — Progress Notes (Signed)
 Follow-up Visit   Date: 03/13/2024    Chloe Rice MRN: [id number] DOB: 1995/09/10    Chloe Rice is a 28 y.o. right-handed Caucasian female with depression/anxiety and Celiac disease returning to the clinic for follow-up of hand pain and weakness.  The patient was accompanied to the clinic by mother and father who also provides collateral information.    IMPRESSION/PLAN: Adult-onset myotonic dystrophy, type 1.  Genetically confirmed, supported by EMG (diffuse myotonia and sparse myopathic motor units) and clinical exam (mild facial weakness, distal hand weakness, percussion myotonia).  Symptoms manifesting with bilateral hand weakness, myalgias, and early cataracts.  Discussed the pathophysiology, diagnosis, management, and prognosis. Extended visit time was spent answering questions and addressing concerns.  Patient was informed that the condition is autosomal dominant with 50% chance of passing the gene to offspring.  Given the potential risk for cardiac arrhythmia and diabetes, annual screening is recommended.  She is understandably concerned about infertility risk, potential cognitive changes, as well as other systemic involvement.  Her father also has cataracts in his 25s, but denies weakness.  They are also thinking that one of her siblings may be affected.  - I recommend that she see genetic counselor  - Referral to [facility]  - She is update to date with eye exam, had 12 lead EKG and echo in April which did not show any arrhythmia or structural abnormalities.   - She has PCP appointment in a few weeks and will get HbA1c checked then - Recommend that she talk to OBGYN to assess her fertility and consider freezing eggs, as she does express plans to have a future family  She may follow-up with me after seeing [facility], or sooner as needed   --------------------------------------------- History of present illness: Starting in June 2025, she began noticing right thumb and 5th  finger numbness, which was mostly noticeable when at work.  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  Later, she began having wrist pain and went to see orthopeadics.  She had NCS/EMG which shows widespread active denervation affecting nearly all the tested muscles, concerning for multilevel radiculopathy vs brachial plexopathy.  MRI brachial plexus was normal.  She tried wearing a brace which did not help.   She denies similar symptoms in the left arm. She has achy right side neck pain, no shooting pain.   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  However, her current wrist pain feels different.   She works as a agricultural engineer.  Nonsmoker.  Occasional alcohol use.    UPDATE 02/13/2024:  She is here for follow-up visit to discuss results of EMG.  She is accompanied by mother.  She continues to have right wrist pain, hand weakness, muscle pain and aches involving the neck and upper arm. She had stiffness in the hands.   Upon further questioning, she has history of bilateral cataract surgery in her early 37s.  Her father also had cataract surgery in his 43s.  No family history of neuromuscular disorder.    UPDATE 03/13/2024:  She is here for follow-up to review genetic testing which results positive for mytonic dystrophy, type 1. She had many questions about the condition and risk of systemic involvement.  Her father had cataract surgery in his 71s and they are concerned that one of her siblings may also have similar symptoms. Much of today's visit was spent discussing diagnosis and  addressing questions.   Medications:  Current Outpatient Medications on File Prior to Visit  Medication Sig Dispense Refill   acetaminophen  (TYLENOL ) 325 MG tablet Take 2 tablets (650 mg total) by mouth every 6 (six) hours as needed for mild pain (pain score 1-3) (or Fever >/= 101).     ALPRAZolam (XANAX) 0.25 MG tablet 1 tablet Orally daily;  Duration: 30 days As needed     hydrOXYzine  (ATARAX ) 50 MG tablet Take 25-50 mg by mouth daily as needed (for sleep or panic).     sertraline  (ZOLOFT ) 50 MG tablet Take 50 mg by mouth daily. (Patient taking differently: Take 100 mg by mouth daily.)     SLYND 4 MG TABS Take 4 mg by mouth daily.     No current facility-administered medications on file prior to visit.    Allergies:  Allergies  Allergen Reactions   Gluten Meal Other (See Comments)    CELIAC DISEASE and Abdominal Pain   Oxycodone  Anxiety and Other (See Comments)    Uncontrollable shaking (advised not to consume) and muscle spasms, also   Morphine      Vital Signs:  BP 118/87   Pulse 86   Ht 5' 2 (1.575 m)   Wt 208 lb (94.3 kg)   SpO2 95%   BMI 38.04 kg/m    Neurological Exam was not performed today as much of today's visit was discussing the diagnosis.    Exam from prior visit on 02/13/2024: MENTAL STATUS including orientation to time, place, person, recent and remote memory, attention span and concentration, language, and fund of knowledge is normal.  Speech is not dysarthric.  CRANIAL NERVES:    Pupils equal round and reactive to light.  Normal conjugate, extra-ocular eye movements in all directions of gaze.  No ptosis.  Face is symmetric, mild transverse smile.  This is trace weakness of orbicularis oculi.  Weakness is present with buccinator testing.  Palate elevates symmetrically.  Tongue is midline.  MOTOR:  No atrophy, fasciculations or abnormal movements.  No pronator drift.  Percussion myotonia is present at the ABP bilaterally.  No grip or eyelid myotonia.   Upper Extremity:  Right  Left  Deltoid  5/5   5/5   Biceps  5/5   5/5   Triceps  5/5   5/5   Wrist extensors  5-/5   5-/5   Wrist flexors  4/5   4/5   Finger extensors  5-/5   5-/5   Finger flexors  5-/5   5-/5   Dorsal interossei  4/5   4/5   Tone (Ashworth scale)  0  0   Lower Extremity:  Right  Left  Hip flexors  5/5   5/5   Hip extensors   5/5   5/5   Adductor 5/5  5/5  Abductor 5/5  5/5  Knee flexors  5/5   5/5   Knee extensors  5/5   5/5   Dorsiflexors  5/5   5/5   Plantarflexors  5/5   5/5   Toe extensors  5/5   5/5   Toe flexors  5/5   5/5   Tone (Ashworth scale)  0  0   MSRs:  Reflexes are 2+/4 throughout.  SENSORY:  Intact to vibration throughout.  COORDINATION/GAIT:    Gait narrow based and stable.   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  Clinical correlation recommended. There  is no evidence of carpal tunnel syndrome, ulnar neuropathy, or cervical radiculopathy affecting the upper extremities.   Genetic testing via [facility] for DMPK gene 04/15/2023:  >150 CTG repeats*  EKG 07/05/2023:  Sinus tachycardia  Echo 07/07/2023: Left ventricular ejection fraction, by estimation, is 60 to 65% . The left ventricle has normal function. The left ventricle has no regional wall motion abnormalities. Left ventricular diastolic parameters were normal. 2. Right ventricular systolic function is normal. The right ventricular size is normal. Tricuspid regurgitation signal is inadequate for assessing PA pressure. 3. The mitral valve is normal in structure. No evidence of mitral valve regurgitation. No evidence of mitral stenosis. 4. The aortic valve is tricuspid. Aortic valve regurgitation is not visualized. No aortic stenosis is present. 5. The inferior vena cava is normal in size with greater than 50% respiratory variability, suggesting right atrial pressure of 3 mmHg. 6. There is a left pleural effusion. Trivial pericardial effusion.  Total time spent reviewing records, interview, history/exam, documentation, and coordination of care on day of encounter:  45 minutes    Thank you for allowing me to participate in patient's care.  If I can answer any additional questions, I would be pleased to do so.     Sincerely,    Abriel Geesey K. Tobie, DO

## 2024-03-14 ENCOUNTER — Encounter: Payer: Self-pay | Admitting: Neurology

## 2024-03-18 ENCOUNTER — Other Ambulatory Visit: Payer: Self-pay

## 2024-03-18 DIAGNOSIS — G7111 Myotonic muscular dystrophy: Secondary | ICD-10-CM

## 2024-03-19 ENCOUNTER — Ambulatory Visit: Admitting: Medical Genetics

## 2024-03-19 VITALS — BP 128/86 | HR 96 | Wt 209.7 lb

## 2024-03-19 DIAGNOSIS — M2480 Other specific joint derangements of unspecified joint, not elsewhere classified: Secondary | ICD-10-CM | POA: Insufficient documentation

## 2024-03-19 DIAGNOSIS — Z1379 Encounter for other screening for genetic and chromosomal anomalies: Secondary | ICD-10-CM

## 2024-03-19 DIAGNOSIS — G7111 Myotonic muscular dystrophy: Secondary | ICD-10-CM | POA: Insufficient documentation

## 2024-03-19 DIAGNOSIS — Q120 Congenital cataract: Secondary | ICD-10-CM | POA: Diagnosis not present

## 2024-03-19 DIAGNOSIS — H269 Unspecified cataract: Secondary | ICD-10-CM | POA: Insufficient documentation

## 2024-03-19 NOTE — Progress Notes (Signed)
 GENETIC COUNSELING NEW PATIENT EVALUATION Patient name: Chloe Rice DOB: Aug 09, 1995 Age: 28 y.o. MRN: [id number]  Referring Provider/Specialty: Tonita Blanch, DO  Date of Evaluation: 03/19/2024 Chief Complaint/Reason for Referral: Myotonic dystrophy type 1   Brief Summary: Chloe Rice is a 28 y.o. female who presents today for an initial genetics evaluation for a new diagnosis of myotonic dystrophy type 1 (DM1). She is accompanied by her mother at today's visit.  Prior genetic testing has been performed. [facility] DMPK Repeat Analysis was positive for Myotonic Dystrophy Type 1 (DM1), with more than 150 CTG repeats.  Report is not available in Labs or Media but was confirmed with the laboratory.   Family History: See pedigree obtained during today's visit under History->Family->Pedigree.  The family history was notable for the following: Sister, 104 yo, with bipolar disorder. Sister, 52 yo, with anxiety. Sister, 43 yo, with anxiety, depression, and celiac disease. Brother, 37 yo, alive and well.  Paternal Family History Father, 60 yo, with cataracts at age 21 yo, memory differences, sleep apnea, ADHD, and hypercholesterolemia. 4 aunts and uncles, with no health concerns. Grandfather, deceased in his 48s, with a history of skin cancer. Grandmother, deceased in her 77s, with a brain aneurysm in her 56s leading to paraplegia.  Maternal Family History Mother, 86 yo, with celiac disease. Aunt, in her 15s, alive and well. Aunt, in her 34s, with breast cancer at 5 yo. Uncle, 62 yo, with T1DM. Grandfather, deceased in his 48s, with COPD and emphysema. Grandmother, deceased at 52 yo, with breast cancer diagnosed at 28 yo and kidney disease.  Mother's ethnicity: Mixed European Father's ethnicity: Polish Consangunity: Denies  Prior Chloe Rice: [facility] DMPK Repeat Analysis: positive (greater than 150 CTG repeats)  Genetic Counseling: Chloe Rice is a 28 y.o. female with a new  diagnosis of myotonic dystrophy (DM1).  For detailed HPI, please see accompanying note from Dr. Chad Haldeman Englert.  Jalaine is the first person in her family to be diagnosed with DM1; however, her father had cataracts requiring surgical correction at 28 yo.  Some individuals with mild DM1 will have isolated cataracts so it is possible that he is affected. Testing for him is recommended to determine the risk to Chloe Rice's siblings.  DM1 is caused by expansion of a CTG trinucleotide repeat in the noncoding region of DMPK. The diagnosis of DM1 is suspected in individuals with characteristic muscle weakness and is confirmed by molecular genetic testing of DMPK. CTG repeat length exceeding 34 repeats is abnormal. Molecular genetic testing detects pathogenic variants in nearly 100% of affected individuals.   We discussed that Chloe Rice was found to have greater than 150 CTG repeats within the DMPK gene, associated with a diagnosis of classic DM1.  For detailed review of expected symptoms and medical management, please see accompanying note from Dr. Chad Haldeman Englert.  DM1 is inherited in an autosomal dominant manner, with anticipation. We discussed that Chloe Rice has two copies of the DMPK gene.  On one of these two copies, Chloe Rice has a pathogenic repeat expansion causing DM1.  We pass one copy of each gene to our children.  Because of this, there is a 50% chance that Chloe Rice would pass on the copy of her DMPK gene with 20 CTG repeats (normal/negative) and a 50% chance that she would pass on her copy of the DMPK gene with more than 150 CTG repeats.  However, when a pathogenic repeat expansion is passed from mother to child, it is more likely to expand and  become even larger, a phenomena called anticipation.  Thus, it is difficult to predict if Chloe Rice's future children would have similar symptoms to herself or more severe symptoms such as congenital DM1.  We did discuss reproductive options including pre-implantation genetic  testing as part of the IVF process. Chloe Rice is unsure if this is something she would be able to afford, I plan to look into charitable organizations that may help with these associated costs and reach to [place] with my findings.  Resources from the [employer] including Mental Health Resources and Patient Guides were provided to Chloe Rice and her family.  She plans to encourage her father to call and schedule his own appointment to confirm the diagnosis in him before discussing testing with her siblings.  Recommendations: Keep [facility] appointment. Encourage father to consider testing Continue follow-up with other healthcare providers as recommended  Date: 03/19/2024 Total time spent: 115 minutes Genetic Counselor-only time: 65 minutes  Time spent includes face to face and non-face to face care for the patient on the date of this encounter (history, genetic counseling, coordination of care, data gathering and/or documentation as outlined).   Lum Molt MS [facility] Certified Genetic Counselor [facility] [employer]

## 2024-03-19 NOTE — Progress Notes (Signed)
 MEDICAL GENETICS NEW PATIENT EVALUATION  Patient name: Chloe Rice DOB: 1995-04-11 Age: 28 y.o. 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 Canale and her mother today. It is possible that this was inherited from her father, and we recommended he consider testing to determine inheritance and recurrence risk for other family members. Ms. Pawlicki has had a thorough workup through neurology, and was referred to a nearby [facility] clinic for further assessment and management. Given the [facility] clinic will be multi-disciplinary and involved providers from several practice areas, we will follow up with Ms. Benninger after this appointment to determine if additional services would be needed (eg, pulmonary evaluation). Recommendations for initial studies, surveillance, and management are provided below. Thyroid  studies and HbA1C are recommended to be done at her next PCP follow up visit. Surveillance and other management would likely be through the [facility] clinic.   We also discussed that Ms. 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 This is also likely a confounding factor related to her symptoms of pain, and would be managed with physical therapy and pain medication. She was encouraged to be active but with low impact activities and appropriate resistance training.  Recommendations: Will ask PCP to check thyroid  studies and HbA1C at next visit. Referral of father for DM1 evaluation/testing. Await [facility] clinic evaluation. Continue follow up with current medical providers per their recommendations. See recommendations below.  Follow up will be based on the results of the [facility] clinic evaluation.   Information on myotonic dystrophy type 1 from [employer] (2024): Clinical  characteristics. Myotonic dystrophy type 1 (DM1) is a multisystem disorder that affects skeletal and smooth muscle as well as the eye, heart, endocrine system, and central nervous system. The clinical findings, which span a continuum from mild to severe, have been categorized into three somewhat overlapping phenotypes: mild, classic, and congenital.    - Mild DM1 is characterized by cataract and mild myotonia (sustained muscle contraction); life span is normal.    - Classic DM1 is characterized by muscle weakness and wasting, myotonia, cataract, and often cardiac conduction abnormalities; adults may become physically disabled and may have a shortened life span.    - Congenital DM1 is characterized by hypotonia and severe generalized weakness at birth, often with respiratory insufficiency and early death; intellectual disability is common. Diagnosis/testing. DM1 is caused by expansion of a CTG trinucleotide repeat in the noncoding region of DMPK. The diagnosis of DM1 is suspected in individuals with characteristic muscle weakness and is confirmed by molecular genetic testing of DMPK. CTG repeat length exceeding 34 repeats is abnormal. Molecular genetic testing detects pathogenic variants in nearly 100% of affected individuals. Management. Treatment of manifestations: Use of ankle-foot orthoses, wheelchairs, or other assistive devices; special education support for affected children; treatment of hypothyroidism; management of pain; consultation with a cardiologist for symptoms or EKG evidence of arrhythmia; removal of cataracts if vision is impaired; hormone replacement therapy for males with hypogonadism; surgical excision of pilomatrixoma and basal cell carcinomas. Prevention of secondary complications: Choice of induction agents, airway care, local anesthesia, and neuromuscular blockade to minimize complications during surgery; cardiac pacemakers or implantable cardioverter-defibrillators may prevent  life-threatening arrhythmias; continue physical activity and maintain appropriate weight. Surveillance: Annual EKG or 24-hour Holter monitoring; annual measurement of fasting serum glucose concentration and glycosylated hemoglobin concentration; ophthalmology  examination every two years; attention to nutritional status; polysomnography for sleep disturbances. Agents/circumstances to avoid: Cholesterol-lowering medications (i.e., statins), which can cause muscle pain and weakness; the anesthetic agent vecuronium; succinylcholine, propofol , and doxorubicin; smoking; obesity; illicit drug use; excessive alcohol intake. Evaluation of relatives at risk: Molecular genetic testing for early diagnosis of relatives at risk to allow treatment of cardiac manifestations, diabetes mellitus, and cataracts. Genetic counseling. DM1 is inherited in an autosomal dominant manner. Offspring of an affected individual have a 50% chance of inheriting the expanded allele. Pathogenic alleles may expand in length during gametogenesis, resulting in the transmission of longer trinucleotide repeat alleles that may be associated with earlier onset and more severe disease than that observed in the parent. Prenatal testing and preimplantation genetic testing are possible when the diagnosis of DM1 has been confirmed by molecular genetic testing in an affected family member.   Recommended Evaluations Following Initial Diagnosis in Adults with Myotonic Dystrophy Type 1 System/Concern Evaluation Comment  Neurologic Baseline neurologic evaluation for strength, balance, sensation Excessive daytime sleepiness is common.  Other Developmental assessment To incl assessment of strength & cognitive function   Consultation w/clinical geneticist &/or genetic counselor   Eyes Ophthalmologic evaluation By an ophthalmologist familiar w/iridescent posterior subcapsular cataract characteristic of DM1  Cardiovascular EKG & Holter monitoring To evaluate  syncope, chest pains, palpitations, & other symptoms of potential cardiac origin   Echocardiogram Consider cardiology referral.  Respiratory Pulmonary function tests Pneumonia & flu vaccinations    Referral to pulmonologist if symptomatic  Endocrine Assessment of thyroid  function Liver enzyme elevation is common.   Fasting blood glucose & HbA1C determination   GI Clinical assessment for poor swallowing, pseudo-obstruction, diarrhea, gallbladder dysfunction Consider internal medicine &/or gastroenterology consultation.  Surgery Evaluate for history of adverse reactions to drugs & anesthesia Consensus guidelines  Pregnancy Obstetric consultation Miscarriage & preterm delivery is common in affected women; see Pregnancy Management.    Treatment of Manifestations Management guidelines have been developed [Turner & Hilton-Jones 2014, Ashizawa et al 2018, Johnson et al 2019].  No specific treatment exists for the progressive weakness in individuals with DM1.  A physiatrist, occupational therapist, or physical therapist can help evaluate affected individuals regarding the need for ankle-foot orthoses, wheelchairs, or other assistive devices as the disease progresses. Orthopedic surgery may benefit children with musculoskeletal deformities [facility] & Sussman 2009].  Increased weakness in DM1 has been associated with both hypothyroidism and certain cholesterol-lowering medications (i.e., statins); thus, some strength may return if these causative factors are eliminated.  Myotonia in DM1 is typically mild to moderate and rarely requires treatment [Ricker et al 1999]. Anecdotally, some individuals have responded to mexiletine or carbamazepine. Logigian et al [2010] and Heatwole et al [2021] found mexiletine 150-200 mg 3x/day effective and safe for treating myotonia.  Pain management can be an important part of DM1 treatment. Different medications and combinations of medications work for some individuals,  although none has been routinely effective; medications that have been used include mexiletine, gabapentin, nonsteroidal anti-inflammatory drugs, low-dose thyroid  replacement, low-dose steroids, and tricyclic antidepressants. When used as part of a comprehensive pain management program, low-dose analgesics may provide relief.  Consultation with a cardiologist is appropriate for individuals with cardiac symptoms or EKG evidence of arrhythmia because fatal arrhythmias can occur prior to other symptoms in individuals with DM1. More advanced, invasive electrophysiologic testing of the heart may be required [facility] et al 2007, Sochala et al 2017, Onita et al 2018].  Cataracts can be removed if they impair  vision. Recurrence after surgery has been reported [Garrott et al 2004].  Males with low serum concentration of testosterone require hormone replacement therapy if they are symptomatic.  In most cases, surgical excision of pilomatrixoma including clear margins and its overlying skin is the preferred treatment [Cigliano et al 2005]. Basal cell cancers require removal.  An extensive review found no evidence for successful treatment of hypersomnia with routine psychostimulants [Annane et al 2006], although others have reported benefit [Talbot et al 2003, Wintzen et al 2007].   Prevention of Secondary Complications Veyckemans & Scholtes [2013] have reviewed the anesthetic management of individuals with DM1. Choice of induction agents, airway care, local anesthesia, and neuromuscular blockade were found to minimize complications during surgery in individuals with DM1. Avoid using succinylcholine. Propofol -induced pain can induce myotonia. Sevoflurane has been used uneventfully.  Cardiac pacemakers or implantable cardioverter-defibrillators may prevent life-threatening arrhythmias [Wahbi et al 2012, Facenda-Lorenzo et al 2013]. Oral et al [2013] presented evidence that obesity, tobacco smoking, physical  inactivity, and alcohol / illicit drug consumption are lifestyle risk factors associated with more severe DM1 phenotypes.  Mechanical ventilation for pulmonary insufficiency may be needed [Boussad et al 2018].  Gallbladder removal is sometimes required [Hilbert et al 2017].  Careful attention to anesthetic management during surgery is required [facility] & Scholtes 2013, Gorelik & Tipton 2018].   Surveillance Turner & Hilton-Jones [2014] provide guidelines for surveillance. The following are appropriate: Annual EKG to detect asymptomatic cardiac conduction defects. Some centers perform annual 24-hour Holter monitoring of individuals with DM1 who do not have cardiac symptoms [S et al 2007, Sovari et al 2007, [place] et al 2009]. Tissue Doppler monitoring has also been proposed [employer] et al 2007, Wahbi et al 2012]. Annual measurement of fasting serum glucose concentration and glycosylated hemoglobin concentration, with treatment for diabetes mellitus if indicated [Matsumura et al 2009] Ophthalmologic examination every two years to evaluate for cataract formation Attention to nutritional status including mastication and trouble eating [Motlagh et al 2005, Engvall et al 2009, [place] et al 2009] Polysomnographic follow up of sleep complaints [Kumar et al 2007]   Agents/Circumstances to Avoid Statins used to lower cholesterol may sometimes cause muscle pain and weakness.  Frederico et al [phone] noted that [n]umerous cases of perioperative complications in patients with DM have been reported. Hazards have been associated with the use of thiopentone, suxamethonium, neostigmine, and halothane. Most complications were pulmonary. The likelihood of perioperative pulmonary complications (PPC) was not related to any specific anesthetic drug. Because of the increased risk of PPC, careful monitoring during the early postoperative period, protection of the upper airways, chest physiotherapy, and incentive  spirometry are mandatory in all symptomatic patients with DM, particularly those with a severe muscular disability or those who have undergone an upper abdominal surgery.  Veyckemans & Scholtes [2013] and Gorelik & Dionisio [2018] have reviewed appropriate anesthetic care for individuals with DM1 and DM2.  Malignant hyperthermia during anesthesia including the use of vecuronium [Nishi et al 2004] has been reported in DM1 but is very uncommon [Kirzinger et al 2010]. (See Malignant Hyperthermia Susceptibility.)  Aggressive doxorubicin-based chemotherapy for lymphoma in a person with DM1 produced sudden atrial fibrillations [Montella et al 2005].  Oral et al [2013] presented evidence that obesity, tobacco smoking, physical inactivity, and alcohol / illicit drug consumption are lifestyle risk factors associated with more severe DM1 phenotypes.   HPI: Tamee Battin is a 28 y.o. assigned female at birth who presents today for an initial genetics evaluation for a new  diagnosis of myotonic dystrophy type 1. She is accompanied by her mother, who helped provide the history. This information, along with a review of pertinent records, labs, and radiology studies, is summarized below.  Ms. Tennison has had right wrist, thumb, and fifth finger pain and tingling starting in early 2025. 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 She was referred to orthopedics and physical medicine/rehab. She had an NCS/EMG in 11/2023 showing widespread active denervation on needle exam, with normal sensory and motor responses. MRI of the right brachial plexus was normal. She was then seen by Dr. Tobie (neurology) initially in 01/2024. 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 Testing for myotonic dystrophy type 1 (DM1) was performed  and showed she had greater than 150 CTG repeats. Ms. Iseman was then referred to the [facility] clinic, as as to genetics for further evaluation and discussion of her results.  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  Past Medical History: Patient Active Problem List   Diagnosis Date Noted   Myotonic dystrophy, type 1 (HCC) 03/19/2024   Cataract 03/19/2024   Celiac disease 11/10/2023   Generalized anxiety disorder 11/10/2023   History of iron deficiency anemia 11/10/2023   Biliary tract obstruction (HCC) 08/03/2023   Gallstone pancreatitis 07/06/2023   Acute gallstone pancreatitis 07/05/2023   Developmental History: Milestones -- no concerns Therapies -- none School -- no concerns  Medications: Medications Ordered Prior to Encounter[1]  Allergies:  Allergies[2]  Review of Systems: Negative except as noted in the HPI  Family History: The family history was notable for the following: Sister, 50 yo, with bipolar disorder. Sister, 39 yo, with anxiety. Sister, 61 yo, with anxiety, depression, and celiac disease. Brother, 44 yo, alive and well.   Paternal Family History Father, 81 yo, with cataracts at age 27 yo, memory differences, sleep apnea, ADHD, and hypercholesterolemia. 4 aunts and uncles, with no health concerns. Grandfather, deceased in his 66s, with a history of skin cancer. Grandmother, deceased in her 9s, with a brain aneurysm in her 56s leading to paraplegia.   Maternal Family History Mother, 12 yo, with celiac disease. Aunt, in her 31s, alive and well. Aunt, in her 59s, with breast cancer at 58 yo. Uncle, 40 yo,  with T1DM. Grandfather, deceased in his 32s, with COPD and emphysema. Grandmother, deceased at 44 yo, with breast cancer diagnosed at 28 yo and kidney disease.   Mother's ethnicity: Mixed European Father's ethnicity: Polish Consangunity: Denies Please see the dentist note for additional information  Social History: Lives by herself in [place]  Vitals: Weight: 209.7 lb   Genetics Physical Exam:  Constitution: The patient is active and alert  Head: No abnormalities detected in: head, hairline, shape or size    Anterior fontanelle flat: not flat    Anterior fontanelle open: not open    Bitemporal narrowing: forehead not narrow    Frontal bossing: no frontal bossing    Macrocephaly: not macrocephalic  Microcephaly: not microcephalic    Plagiocephaly: not plagiocephalic  Face: (comments: Slight flat affect)  Eyes: (comments: Slight ptosis)  Cardiac: No abnormalities detected in: cardiovascular system    Abnormal distal perfusion: normal distal perfusion    Irregular rate: heart rate regular    Irregular rhythm: regular rhythm    Murmur: no murmur  Lungs: No abnormalities detected in: pulmonary system, bilateral auscultation or effort  Abdomen: No abnormalities detected in: abdomen or appearance    Abnormal umbilicus: normal umbilicus    Diastasis recti: no diastasis recti    Distended abdomen: no distension    Hepatosplenomegaly: no hepatosplenomegaly    Umbilical hernia: no umbilical hernia  Spine: No abnormalities detected in: spine    Sacral anomalies: sacrum normal    Scoliosis: no scoliosis    Sacral dimple: no sacral dimple  Neurological: No abnormalities detected in: deep tendon reflexes (comments: Mild myotonia of hands)  Genitourinary: not assessed  Hair, Nails, and Skin: No abnormalities detected in: integumentary system, hair, nails or skin    Abnormally healed scars: no abnormally healed scars    Birthmarks: no  birthmarks    Lesions: no lesions  Extremities: (comments: Hypermobility of multiple joints)   Finlay Mills Haldeman-Englert, MD [facility]etics Date: 03/19/2024 Time: 1645   Total time spent: 90 minutes Time spent includes face to face and non-face to face care for the patient on the date of this encounter (history and physical, genetic counseling, coordination of care, data gathering and/or documentation as outlined).  Genetic counselor: Lum Molt, MS, CGC     [1]  Current Outpatient Medications on File Prior to Visit  Medication Sig Dispense Refill   acetaminophen  (TYLENOL ) 325 MG tablet Take 2 tablets (650 mg total) by mouth every 6 (six) hours as needed for mild pain (pain score 1-3) (or Fever >/= 101).     ALPRAZolam (XANAX) 0.25 MG tablet 1 tablet Orally daily; Duration: 30 days As needed     hydrOXYzine  (ATARAX ) 50 MG tablet Take 25-50 mg by mouth daily as needed (for sleep or panic).     sertraline  (ZOLOFT ) 50 MG tablet Take 50 mg by mouth daily. (Patient taking differently: Take 100 mg by mouth daily.)     SLYND 4 MG TABS Take 4 mg by mouth daily.     No current facility-administered medications on file prior to visit.  [2]  Allergies Allergen Reactions   Gluten Meal Other (See Comments)    CELIAC DISEASE and Abdominal Pain   Oxycodone  Anxiety and Other (See Comments)    Uncontrollable shaking (advised not to consume) and muscle spasms, also   Morphine 

## 2024-04-10 ENCOUNTER — Telehealth: Payer: Self-pay | Admitting: Neurology

## 2024-04-10 NOTE — Telephone Encounter (Signed)
 Pt called in this afternoon. Pt have been having numbness in her left foot and she fell today. Please call. Thanks

## 2024-04-11 NOTE — Telephone Encounter (Signed)
 Called patient and informed her that she will need a follow up. Offered 04/16/24 at 9:30 am. [facility] with patient to scheduled that date and time. Patient had no further questions or concerns.

## 2024-04-11 NOTE — Telephone Encounter (Signed)
 She will need a follow-up visit. Ok to add on 1/13 at 9:30a.

## 2024-04-16 ENCOUNTER — Ambulatory Visit: Admitting: Neurology

## 2024-04-16 ENCOUNTER — Encounter: Payer: Self-pay | Admitting: Neurology

## 2024-04-16 VITALS — BP 114/83 | HR 99 | Ht 62.0 in | Wt 219.0 lb

## 2024-04-16 DIAGNOSIS — M25531 Pain in right wrist: Secondary | ICD-10-CM | POA: Diagnosis not present

## 2024-04-16 DIAGNOSIS — G7111 Myotonic muscular dystrophy: Secondary | ICD-10-CM | POA: Diagnosis not present

## 2024-04-16 DIAGNOSIS — R200 Anesthesia of skin: Secondary | ICD-10-CM

## 2024-04-16 NOTE — Patient Instructions (Signed)
 Nerve testing of the right arm and left leg  Start physical therapy and occupational therapy  ELECTROMYOGRAM AND NERVE CONDUCTION STUDIES (EMG/NCS) INSTRUCTIONS  How to Prepare The neurologist conducting the EMG will need to know if you have certain medical conditions. Tell the neurologist and other EMG lab personnel if you: Have a pacemaker or any other electrical medical device Take blood-thinning medications Have hemophilia, a blood-clotting disorder that causes prolonged bleeding Bathing Take a shower or bath shortly before your exam in order to remove oils from your skin. Dont apply lotions or creams before the exam.  What to Expect Youll likely be asked to change into a hospital gown for the procedure and lie down on an examination table. The following explanations can help you understand what will happen during the exam.  Electrodes. The neurologist or a technician places surface electrodes at various locations on your skin depending on where youre experiencing symptoms. Or the neurologist may insert needle electrodes at different sites depending on your symptoms.  Sensations. The electrodes will at times transmit a tiny electrical current that you may feel as a twinge or spasm. The needle electrode may cause discomfort or pain that usually ends shortly after the needle is removed. If you are concerned about discomfort or pain, you may want to talk to the neurologist about taking a short break during the exam.  Instructions. During the needle EMG, the neurologist will assess whether there is any spontaneous electrical activity when the muscle is at rest - activity that isnt present in healthy muscle tissue - and the degree of activity when you slightly contract the muscle.  He or she will give you instructions on resting and contracting a muscle at appropriate times. Depending on what muscles and nerves the neurologist is examining, he or she may ask you to change positions during the  exam.  After your EMG You may experience some temporary, minor bruising where the needle electrode was inserted into your muscle. This bruising should fade within several days. If it persists, contact your primary care doctor.

## 2024-04-16 NOTE — Progress Notes (Signed)
 "   Follow-up Visit   Date: 04/16/2024    Chloe Rice MRN: [id number] DOB: 03/17/1996    Chloe Rice is a 29 y.o. right-handed Caucasian female with depression/anxiety and Celiac disease returning to the clinic for follow-up of hand pain and weakness.  The patient was accompanied to the clinic by mother and father who also provides collateral information.    IMPRESSION/PLAN: Assessment & Plan Sensory disturbances of the left foot and right hand.  Intermittent numbness and tingling in left foot and right hand, possibly ulnar neuropathy. NCS/EMG from November performed on the arms did not show entrapment neuropathy, however, her symptoms are new since this study.  - NCS/EMG of the right arm and left leg   Adult-onset myotonic dystrophy, type 1, genetically confirmed, manifesting with bilateral hand weakness, myalgias, early cataract.   - Continue follow-up with [facility] on April 14th. - Start PT and OT   --------------------------------------------- History of present illness: Starting in June 2025, she began noticing right thumb and 5th finger numbness, which was mostly noticeable when at work.  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  Later, she began having wrist pain and went to see orthopeadics.  She had NCS/EMG which shows widespread active denervation affecting nearly all the tested muscles, concerning for multilevel radiculopathy vs brachial plexopathy.  MRI brachial plexus was normal.  She tried wearing a brace which did not help.   She denies similar symptoms in the left arm. She has achy right side neck pain, no shooting pain.   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  However, her current wrist pain feels different.   She works as a agricultural engineer.  Nonsmoker.  Occasional alcohol use.    UPDATE 02/13/2024:  She is here for follow-up visit to discuss results of EMG.  She is accompanied by  mother.  She continues to have right wrist pain, hand weakness, muscle pain and aches involving the neck and upper arm. She had stiffness in the hands.   Upon further questioning, she has history of bilateral cataract surgery in her early 54s.  Her father also had cataract surgery in his 72s.  No family history of neuromuscular disorder.    UPDATE 03/13/2024:  She is here for follow-up to review genetic testing which results positive for mytonic dystrophy, type 1. She had many questions about the condition and risk of systemic involvement.  Her father had cataract surgery in his 18s and they are concerned that one of her siblings may also have similar symptoms. Much of today's visit was spent discussing diagnosis and addressing questions.   UPDATE 04/16/2024:   Discussed the use of AI scribe software for clinical note transcription with the patient, who gave verbal consent to proceed.  History of Present Illness Chloe Rice is a 29 year old female with myotonic dystrophy who presents with numbness and tingling in her left foot and right hand. She is accompanied by her father.  She has been experiencing numbness in her left foot since around Christmas, occurring daily, typically twice a day, and lasting about five to ten minutes. The numbness affects the entire foot, with occasional slight extension past the ankle, and is followed by a painful 'pins and needles' sensation. The episodes are not relieved by movement or changing position. She fell down six steps last Wednesday and experienced another fall while ascending stairs on Saturday due to the  numbness, though she was caught by [place] and did not sustain significant injury.  She also reports numbness in her right hand, specifically affecting a few fingers, which began about a week ago. This occurs three to four times a day and is not alleviated by massaging the area. She mentions that she has not been leaning on her elbows to avoid exacerbating her  symptoms. The numbness does not wake her from sleep. She has a history of a distal radius fracture on her wrist and has previously seen orthopedics for wrist pain, which she initially thought was related to her carpal tunnel syndrome.  She has a diagnosis of myotonic dystrophy and has appointment at the [facility] center on April 14th.   Medications:  Current Outpatient Medications on File Prior to Visit  Medication Sig Dispense Refill   acetaminophen  (TYLENOL ) 325 MG tablet Take 2 tablets (650 mg total) by mouth every 6 (six) hours as needed for mild pain (pain score 1-3) (or Fever >/= 101).     ALPRAZolam (XANAX) 0.25 MG tablet 1 tablet Orally daily; Duration: 30 days As needed     hydrOXYzine  (ATARAX ) 50 MG tablet Take 25-50 mg by mouth daily as needed (for sleep or panic).     sertraline  (ZOLOFT ) 50 MG tablet Take 50 mg by mouth daily.     SLYND 4 MG TABS Take 4 mg by mouth daily.     No current facility-administered medications on file prior to visit.    Allergies:  Allergies  Allergen Reactions   Gluten Meal Other (See Comments)    CELIAC DISEASE and Abdominal Pain   Oxycodone  Anxiety and Other (See Comments)    Uncontrollable shaking (advised not to consume) and muscle spasms, also   Morphine      Vital Signs:  BP 114/83   Pulse 99   Ht 5' 2 (1.575 m)   Wt 219 lb (99.3 kg)   SpO2 98%   BMI 40.06 kg/m     MENTAL STATUS including orientation to time, place, person, recent and remote memory, attention span and concentration, language, and fund of knowledge is normal.  Speech is not dysarthric.  CRANIAL NERVES:    Pupils equal round and reactive to light.  Normal conjugate, extra-ocular eye movements in all directions of gaze.  No ptosis.  Face is symmetric, mild transverse smile.  Palate elevates symmetrically.  Tongue is midline.  MOTOR:  No atrophy, fasciculations or abnormal movements.  No pronator drift.    Upper Extremity:  Right  Left  Deltoid  5/5   5/5   Biceps  5/5    5/5   Triceps  5/5   5/5   Wrist extensors  5-/5   5-/5   Wrist flexors  4/5   4/5   Finger extensors  5-/5   5-/5   Finger flexors  5-/5   5-/5   Dorsal interossei  4/5   4/5   Tone (Ashworth scale)  0  0   Lower Extremity:  Right  Left  Hip flexors  5/5   5/5   Knee flexors  5/5   5/5   Knee extensors  5/5   5/5   Dorsiflexors  5/5   5/5   Plantarflexors  5/5   5/5   Toe extensors  5/5   5/5   Toe flexors  5/5   5/5   Tone (Ashworth scale)  0  0   MSRs:  Reflexes are 2+/4 throughout.  SENSORY:  Mildly reduced  vibration at the left foot and right hand.  Temperature and pin prick reduced in the right 4th and 5th finger, intact in the left foot.   COORDINATION/GAIT:    Gait narrow based and stable.   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  Clinical correlation recommended. There is no evidence of carpal tunnel syndrome, ulnar neuropathy, or cervical radiculopathy affecting the upper extremities.   Genetic testing via [facility] for DMPK gene 04/15/2023:  >150 CTG repeats*  EKG 07/05/2023:  Sinus tachycardia  Echo 07/07/2023: Left ventricular ejection fraction, by estimation, is 60 to 65% . The left ventricle has normal function. The left ventricle has no regional wall motion abnormalities. Left ventricular diastolic parameters were normal. 2. Right ventricular systolic function is normal. The right ventricular size is normal. Tricuspid regurgitation signal is inadequate for assessing PA pressure. 3. The mitral valve is normal in structure. No evidence of mitral valve regurgitation. No evidence of mitral stenosis. 4. The aortic valve is tricuspid. Aortic valve regurgitation is not visualized. No aortic stenosis is present. 5. The inferior vena cava is normal in size with greater than 50% respiratory variability, suggesting right atrial pressure of 3 mmHg. 6. There is a left  pleural effusion. Trivial pericardial effusion.     Thank you for allowing me to participate in patient's care.  If I can answer any additional questions, I would be pleased to do so.    Sincerely,    Charleton Deyoung K. Tobie, DO   "

## 2024-05-31 ENCOUNTER — Encounter: Payer: Self-pay | Admitting: Neurology

## 2024-06-04 ENCOUNTER — Ambulatory Visit: Admitting: Medical Genetics
